# Patient Record
Sex: Male | Born: 2005 | Race: Black or African American | Hispanic: No | Marital: Single | Smoking: Never smoker
Health system: Southern US, Community
[De-identification: ages and names within clinical notes are randomized; demographics above are authoritative.]

## PROBLEM LIST (undated history)

## (undated) DIAGNOSIS — Z98811 Dental restoration status: Secondary | ICD-10-CM

## (undated) DIAGNOSIS — F809 Developmental disorder of speech and language, unspecified: Secondary | ICD-10-CM

## (undated) DIAGNOSIS — L309 Dermatitis, unspecified: Secondary | ICD-10-CM

## (undated) DIAGNOSIS — R21 Rash and other nonspecific skin eruption: Secondary | ICD-10-CM

## (undated) DIAGNOSIS — T7840XA Allergy, unspecified, initial encounter: Secondary | ICD-10-CM

## (undated) DIAGNOSIS — R6250 Unspecified lack of expected normal physiological development in childhood: Secondary | ICD-10-CM

## (undated) DIAGNOSIS — H7290 Unspecified perforation of tympanic membrane, unspecified ear: Secondary | ICD-10-CM

## (undated) DIAGNOSIS — H52209 Unspecified astigmatism, unspecified eye: Secondary | ICD-10-CM

## (undated) DIAGNOSIS — H9190 Unspecified hearing loss, unspecified ear: Secondary | ICD-10-CM

## (undated) HISTORY — PX: ADENOIDECTOMY: SUR15

## (undated) HISTORY — PX: CIRCUMCISION: SUR203

## (undated) HISTORY — PX: ADENOIDECTOMY: SHX5191

## (undated) HISTORY — PX: TYMPANOSTOMY TUBE PLACEMENT: SHX32

---
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
## 2024-01-01 ENCOUNTER — Telehealth (INDEPENDENT_AMBULATORY_CARE_PROVIDER_SITE_OTHER): Payer: Self-pay | Admitting: Pediatrics

## 2024-01-01 NOTE — Telephone Encounter (Signed)
 Who's calling (name and relationship to patient) :Toni Amend; Herbalist; [facility]   Best contact number: [phone]( work) [phone])  Provider they see: Angela Nevin, NP   Reason for call: Toni Amend was calling in to get info to help prepare [facility] for the upcoming appt.   She will also fax/email over 2 way consent.    Call ID:      PRESCRIPTION REFILL ONLY  Name of prescription:  Pharmacy:

## 2024-01-02 NOTE — Telephone Encounter (Signed)
 DPR scanned into the chart. Contacted Courtney, verified patiens name and DOB.   Toni Amend wanted to know what the patients initial appointment would Intel so that she can prepare the patient for the appointment.   Toni Amend stated that [facility] may not open up at this visit but she & mom will be with him to voice their concerns to the provider. The provider will determine the plan of care with them after they voice their concerns.  Courtney asked for a description of the layout of the building as she plans to walk with the patient into the office. Domenica Reamer was able to take over the call to further assist.  SS, CCMA

## 2024-01-05 NOTE — Telephone Encounter (Signed)
 Consent form has been received.

## 2024-01-07 ENCOUNTER — Encounter (INDEPENDENT_AMBULATORY_CARE_PROVIDER_SITE_OTHER): Payer: Self-pay | Admitting: Pediatrics

## 2024-01-07 ENCOUNTER — Ambulatory Visit (INDEPENDENT_AMBULATORY_CARE_PROVIDER_SITE_OTHER): Payer: MEDICAID | Admitting: Pediatrics

## 2024-01-07 VITALS — BP 138/84 | HR 72 | Ht 67.0 in | Wt 226.0 lb

## 2024-01-07 DIAGNOSIS — R4689 Other symptoms and signs involving appearance and behavior: Secondary | ICD-10-CM

## 2024-01-07 NOTE — Patient Instructions (Signed)
 - Referred to Dr. Corrin Parker for autism and psychological evaluation - if you do not hear from them for an appointment please call - Please provide a copy of IEP with evaluations for Dr. Corrin Parker - Please follow-up with legal aid and case management after evaluation with Dr. Corrin Parker - Please see the following resources for autism - Please STOP melatonin 12 mg nightly x 30 days. Re-start at 2-3 mg after a "melatonin holiday" - Please make an appointment with Forbes Cellar after evaluation with Dr. Corrin Parker to discuss potential medication management    Parent Training for child with ASD: It will be important for your child to receive extensive and intensive educational and intervention services on an ongoing basis.  As part of this intervention program, it is imperative that as parents you receive instruction and training in bolstering patient's social and communication skills as well as managing challenging behavior.  See resources below:  [facility] Autism Program - A program founded by [employer] that offers numerous clinical services including support groups, recreation groups, counseling, parent training, and evaluations.  They also offer evidence based interventions, such as Structured TEACCHing:         At [facility], we provide intervention services for children and adults with Autism Spectrum Disorder and their families utilizing the strategies of Structured TEACCHing. Sessions for school-age children involve parent coaching and adult sessions can be attended independently or involve family members. All sessions are individualized to address the individual's/family's unique goals and typically occur once weekly for up to 12-15 weeks. Goals for School-aged Children: Psychoeducation about ASD ? Daily living skills ? Behavior ? Emotion regulation ? Attention ? Organization ? Communication ? Social skills    Their main office is in [place] but they have regional centers across the state, including one in  [place]. Main Office Phone: [phone] [facility] Office: [address], [place], [place] [address].  [place] Phone: [phone]   [employer] in [place] offers direct instruction on how to parent your child with autism.  [employer] [facility]! Individualized family sessions for parents/caregivers of children with autism. Gain confidence using autism-specific evidence-based strategies. Feel empowered as a caregiver of your child with autism. Develop skills to help troubleshoot daily challenges at home and in the community. Family Session: One-on-one instructional sessions with child and primary caregiver. Evidence-based strategies taught by trained autism professionals. Focus on: social and play routines; communication and language; flexibility and coping; and adaptive living and self-help. Financial Aid Available See Family Sessions:[employer] Go! On the their website: [email or web address].hu Contact Danae Chen at [phone], ext. [phone] or [email or web address]   [facility] [place] [facility] also offers FREE weekly classes, often with a focus on addressing challenging behavior and increasing developmental skills. [email or web address]  Autism Society of [place] - offers support and resources for individuals with autism and their families. They have specialists, support groups, workshops, and other resources they can connect people with, and offer both local (by county) and statewide support. Please visit their website for contact information of different county offices. https://www.autismsociety-[place].org/  After the Diagnosis Workshops:   "After the Diagnosis: Get Answers, Get Help, Get Going!" sessions on the first Tuesday of each month from 9:30-11:30 a.m. at our [place] office located at [address].  Geared toward families of ages 45-8 year olds.   Registration is free and can be accessed online at our  website:  [email or web address].[email or web address]/calendar/ or by Darrick Penna Smithmyer for more information at [email or web address]  [employer] provides  video based training on autism, treatments, and guidance for managing associated behavior.  This website is free for access the family's most register for first review the content: H TTP://www.autisminternetmodules.org/  The [employer] [facility]) - This website offers Autism Focused Intervention Resources & Modules (AFIRM), a series of free online modules that discuss evidence-based practices for learners with ASD. These modules include case examples, multimedia presentations, and interactive assessments with feedback. https://afirm.PureLoser.pl  [facility]: [facility] - JumpStart (serving 18 month- 19 y/o) is a six-week parent empowerment program that provides information, support, and training to parents of young children who have been recently diagnosed with or are at risk for ASD. JumpStart gives family access to critical information so parents and caregivers feel confident and supported as they begin to make decisions for their child. JumpStart provides information on Applied Behavior Analysis (ABA), a highly effective evidence-based intervention for autism, and Pivotal Response Treatment (PRT), a behavior analytic intervention that focuses on learner motivation, to give parents strategies to support their child's communication. Private pay, accepts most major insurance plans, scholarship funding Https://www.autismcenter.org/jumpstart [phone]   Crisis by [facility] if this is a medical or life threatening emergency.  If you need the police, ask for a CIT officer. They have received extra training on handling these situations.  If this is NOT a medical or life threatening emergency, look in the directory below for resources in your county  Crisis Services  for are managed by specific agencies depending on your county of residence  YOU HAVE A CHOICE ABOUT HOW TO GET SERVICES WHEN YOU ARE IN A CRISIS  Phone First. There is an [employer] is available 24 hours a day, 7 days a week. Customer Service Specialists will assist you to find a crisis provider that is well-matched with your needs. Find your local number below.   If you already have a service provider, call them first. Providers who know you are usually best prepared to assist you in a crisis.  Have Support Come to You. Crisis situations are often best resolved at home. Mobile Crisis Teams are available 24 hours a day in all counties. Professional counselors will speak with you and your family during a visit. They have an average response time of 2 hours. See below for your Mobile Crisis contact  Go To A Crisis Center. Many counties have a specialized crisis center where you can walk in for a crisis assessment and referrals to additional services. Appointments are not needed. The crisis center is listed below  For additional information by county, please visit the following website: [email or web address].pt   [employer] Managed by: Phone [employer] Crisis Team Go to a [facility]  [facility] [phone] Psychotherapeutic Services [phone] [facility] [address] [place] [phone] Sunday - Saturday - 8:00 a.m. - 8:00 p.m.  [facility] [phone] [facility] [phone] [facility] Health Services [facility] [address] [place], [place] [address] [phone] Monday - Friday 8a - 5p   Coralee Pesa [facility] Health Management [phone] [facility] Recovery Services [phone] [facility] Recovery Services [address] Juline Patch [place] [address] [phone] Monday -- Friday - 8:00 a.m. - 8:00 p.m.   [employer] [facility]al Health  Management  [phone] [facility] Recovery Services [phone]  None available CALL [phone]   [facility] Behavioral Health Management [phone] [facility] Recovery Services [phone] [facility] Recovery Services [address]  [place], [place], [place] [address] [phone] Monday -- Friday - 8:00 a.m. - 5:00 p.m.   [place] Resources [phone] [facility] [phone] [facility] [address] [place], [place] [address] [phone] Monday -- Friday - 8:00 a.m. - 5:00 p.m.   [facility] Management [phone] [facility] Recovery Services [phone] [facility] Recovery Services [address]Ninfa Meeker [phone] Monday -- Friday - 8:00 a.m. - 5:00 p.m   [facility] Health [phone] [facility] Recovery Services [phone] [facility] Recovery Services [address], [place], [place] [address] [phone] Monday -- Friday - 8:00 a.m. - 5:00 p.m   [facility] [phone] [facility] [phone] [facility] Recovery Services [address] [place], [place], [place] [address] [phone] Monday -- Friday - 8:00 a.m. - 5:00 p.m.   [employer] Health [phone] or for TTY - contact  Relay at [facility] Recovery Services [phone] None available CALL [phone]  [facility] Behavioral Health Management [phone] [facility] Recovery Services [phone] [facility] Recovery Services [address]; Letitia Libra [phone] Monday -- Friday - 8:00 a.m. - 5:00 p.m   [facility] [phone] [facility] Recovery Services [phone]  [facility] Recovery Services [address] [place] [address] [place], [place]  [address] Monday - Friday 8a - 5p [phone]  [facility] Management [phone] [facility] Recovery Services [phone] [facility] Recovery Services [address] Elliot Gurney [phone] Monday -- Friday - 9:00  a.m. - 3:00 p.m.       Sleep and Autism: Sleep challenges are common in ASD; therefore, it is recommended that the family follow-up with their developmental pediatrician to ask for advice. More information about sleep challenges and ASD, as well as suggestions for families, can be found here: https://www.autismspeaks.org/sleep https://www.autism.org.uk/advice-and-guidance/topics/physical-health/sleep   Sleep Hygiene: Sleep challenges likely contribute to difficulties with behavior; therefore, getting adequate sleep is very important. Establishing consistent sleep hygiene procedures is recommended to help improve sleep patterns.  Ideas include discontinuing the consumption of sugar and beverages after dinner, maintaining a set bedtime. and limiting screen time one hour before bedtime. Daylight is also key to regulating daily sleep patterns. Try to get Damyon outside in natural sunlight for at least 30 minutes each day. Aim for him to get an hour of exposure to morning sunlight and turn down the lights before bedtime.  Lastly, practice a calming bedtime routine that is the same every night. Start with a relaxing bath, putting on pajamas in low light, and finish with a story or a song with them in bed and under the covers.   Sleep & Anxiety: We recommend visiting www.babysleep.com as they provide sleep expectations and advice based on your child's age. Arkin may benefit from using a comfort object (e.g., blanket, toy) at night to help him fall asleep and self-soothe when he wakes in the night. Parents might initially help him use the comfort object to help him practice self-soothing. A social story may also help them learn about sleeping in their own bed or falling back asleep when he wakes in the night.

## 2024-01-07 NOTE — Progress Notes (Signed)
 [facility]-DEVELOPMENTAL AND BEHAVIORAL Dept: [phone]   New Patient Initial Visit   Tony [name] is a 18 y.o. referred to Developmental Behavioral Pediatrics for the following concerns: "psycho-educational testing/autism evaluation due to pt with strong features of ASD but never diagnosed. Needs assessment for academic and work training placement plus general care as adult" per referral 09/19/23  Tony [name] was referred by Maree Erie, MD who "has been his doctor since 10 days old"  History of present concerns: Tony [name] is a 18yo, male, who presents to the office with his mother, Tony [name], for a referral for a psychological evaluation and autism testing. Mom reports she has been attempting to get Tony [name] for autism for several years "he needs a proper diagnosis" She reports she has been "fighting with the schools for a long time -  a counselor at the school told me he was no where near on the spectrum but I knew things were off."  Social-emotional reciprocity: - Social-emotional reciprocity (eg, failure of back-and-forth conversation; reduced sharing of interests, emotions)   COMMENTS  Difficulty maintaining a conversation [x] YES [] NO Avoidant  Abnormal sharing of enjoyment [] YES [] NO   Abnormal back and forth play [] YES [] NO   Abnormal social approach [x] YES [] NO Intrusive when younger - poor recognition of social cues  Reduced sharing emotion/affect [] YES [] NO   Abnormal social imitation [] YES [] NO   Abnormal response to name [] YES [] NO   Idiosyncratic phrases/speech "Can you think of any phrases your child says that seem different from what other children their age would say?" "Are there any specific words or expressions your child uses that only you seem to understand?" "Does your child have any unique ways of asking for things or expressing needs?" [] YES [] NO   Abnormal initiation of social interaction   [x] YES [] NO   Fails to show  appropriate interest in peer's interests [x] YES [] NO     Nonverbal communication: - Nonverbal communicative behaviors used for social interaction (eg, poorly integrated verbal and nonverbal communication; abnormal eye contact or body language; poor understanding of gestures)   COMMENTS  Abnormal eye contact [x] YES [] NO Avoidant  Lack of or decreased use of gestures [] YES [] NO   Lack of use of a point [] YES [] NO   Inability to follow a point [] YES [] NO   Decreased use of facial expressions [] YES [] NO   Difficulty reading nonverbal social cues/facial expressions [x] YES [] NO   Poorly integrated verbal/nonverbal communication [x] YES [] NO   Unusual speech patterns [] YES [] NO      Developing and maintaining relationships: - Developing, maintaining, and understanding relationships (eg, difficulty adjusting behavior to social setting; difficulty making friends; lack of interest in peers)   COMMENTS  Difficulty making friends [x] YES [] NO   Difficulty keeping friends [x] YES [] NO   Lack of interest in other people [x] YES [] NO       Prefers to be alone [x] YES [] NO   Does not pay attention to peers' interests [x] YES [] NO Especially when younger  Difficulty sharing imaginative play with peers [] YES [] NO   Inability to understand another person's perspective [x] YES [] NO   Interacts better with adults than peers [] YES [] NO   Difficulty forming meaningful relationships [] YES [] NO   Lack of interest in play dates or outings with peers outside of school/therapy   [] YES [] NO     Stereotypical behaviors: - Stereotyped or repetitive movements, use of objects, or speech (eg, stereotypes, echolalia, ordering toys, etc) - Insistence on sameness, unwavering adherence to routines, or ritualized patterns of  behavior (verbal or nonverbal)   COMMENTS  Scripted speech/echolalia "Does your child repeat certain phrases or words often, even when they don't seem to fit the situation?" "Are there any phrases your  child says that seem like they are 'scripting' or repeating something they've heard before?" [] YES [] NO   Hand flapping or other Unusual hand movements [] YES [] NO   Spinning self or objects [] YES [] NO   Lining toys [x] YES [] NO Now stacking items  Repetitive play [x] YES [] NO   Preoccupation with parts of objects [] YES [] NO   Repetitive movements: pacing, rocking [] YES [] NO   Self abusive behavior [] YES [] NO   Looks at objects close to eyes or out of corners of eyes or at unusual angles [] YES [] NO   toe walking [] YES [] NO   Other        Restricted Interests Restricted, repetitive patterns of behavior, interests, or activities;  - Highly restricted, fixated interests that are abnormal in strength or focus (eg, preoccupation with certain objects; perseverative interests)    COMMENTS  Current Obsessions/Restricted interests [] YES [] NO   Past restricted interests [] YES [] NO   Talks about a subject excessively [] YES [] NO   Fascination with numbers/letters or patterns [] YES [] NO   Unusual interests [] YES [] NO   Attachment to unusual inanimate objects [] YES [] NO      Unusual Need for Routine   Comments  Upset by changes in routine/schedule [x] YES [] NO   Difficulty with transitions [x] YES [] NO   Upset by trivial changes [x] YES [] NO   Resistant to change in environment [x] YES [] NO   Need for things to be organized in a certain way  [x] YES [] NO   Ritualized patterns of behavior [] YES [] NO     Hyper/Hypo sensitivity  - Increased or decreased response to sensory input or unusual interest in sensory aspects of the environment (eg, adverse response to particular sounds; apparent indifference to temperature; excessive touching/smelling of objects)   Comments  General [] YES [] NO   Auditory [x] YES [] NO "Severe" CAPD  Visual  [] YES [] NO   Touch [] YES [] NO   Movement [] YES [] NO   Oral [] YES [] NO   Smell  [] YES [] NO     Behavioral concerns: Aggression. He has stopped having conversations  with mom. Points in her face. He seems angry with her. "There is always something happening at this hotel." He does go outside and walk around - not a great area where the hotel is. Built a fort on bed - if you even try to move anything he will confront mom. Bullied from pre-K up throughout middle school and HS. Mom thought it had stopped. Since 5th grade hygiene has dropped off which continues - "it's a big fight - he used to take pride in how he looks."  Developmental status: Met them all in a timely manner however regression noted. 18yo doing 50-100 piece puzzles often on his own. 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 Able to make some friends. Independent with ADLs - can cook. 16-20 months hearing exam "were flat line" has had multiple ear tube placements.  School history: [employer] - "wants to be there because his brother graduated from there" - 12th grade - grades "somewhat okay" They are telling him he can graduate. Fear of the bus due to being bullied. Mom reports Tony [name] has never had behavior issues at school except for one time when he defended a peer. "He  has a superman complex"  School supports: [x] Does     [] Does not  have a    [] 504 plan or    [x] IEP   at school  Sleep: From early childhood if going to bed at 7pm he is up at 3am and he wants to eat. When he can't sleep he will talk to himself (doesn't talk much during the day). Now going to sleep at 0200-0300 in the morning "it's a fight" Takes melatonin 12 mg - recommended taking a "melatonin holiday" Frequently moving hotels which has been disruptive. Mom has insomnia as well.  Appetite: Not a picky eater - appetite is great - eats a variety of foods  Medication trials: Wellbutrin - "adverse side effects" Adderall "started Tony dose and he had side effects" - years ago Has had pharmacogenetic  testing Allergies to certain meds "Augmentin" Certain dyes increased hyperactivity Sugary drinks cause more agitation  Therapy interventions: Has seen psychiatry. Outside therapy was inconsistent for a time. Currently going to Tony [name] therapy. Virtual weekly sessions since December 2024 - Tony [name], Tony [name] - she is aware of his behaviors.  Medical workup: Hearing: "intermittent hearing loss" Vision: No concerns Genetic testing: No Other labs: Routine labs Imaging: None  Previous Evaluations: For IEP at school - copy requested for chart and future evaluations  Past Medical History:  Diagnosis Date   Allergy    seasonal allergies   Astigmatism    wears glasses   Dental crowns present    upper   Developmental delay    Eczema    Hearing loss    failed hearing screening at school   Jaundice of newborn    resolved   Rash 04/06/2013   elbows   Speech delay    Tympanic membrane perforation 03/2013   left     family history includes Anesthesia problems in his mother; Asthma in his brother and mother; Diabetes in his maternal aunt and maternal grandmother; Heart disease in his maternal grandmother; Hypertension in his mother; Other in his maternal grandfather; Sickle cell trait in his brother and mother; Thyroid disease in his mother.   Social History   Socioeconomic History   Marital status: Single    Spouse name: Not on file   Number of children: Not on file   Years of education: Not on file   Highest education level: Not on file  Occupational History   Not on file  Tobacco Use   Smoking status: Never   Smokeless tobacco: Never  Substance and Sexual Activity   Alcohol use: No   Drug use: No   Sexual activity: Not on file  Other Topics Concern   Not on file  Social History Narrative   Lives with mother and has adult brother in graduate school. No pets.   Enjoys:watching TV, going for walks, playing video   Social Drivers of Health   Financial Resource Strain: Not  on file  Food Insecurity: Food Insecurity Present (06/13/2023)   Hunger Vital Sign    Worried About Running Out of Food in the Last Year: Sometimes true    Ran Out of Food in the Last Year: Sometimes true  Transportation Needs: No Transportation Needs (03/26/2023)   PRAPARE - Administrator, Civil Service (Medical): No    Lack of Transportation (Non-Medical): No  Physical Activity: Not on file  Stress: Not on file  Social Connections: Not on file     Birth History   Delivery Method: Vaginal, Spontaneous  Gestation Age: 23 wks    Swallowed meconium at birth, suctioned in delivery room, no NICU.     Screening Results   Newborn metabolic     Hearing      Review of Systems  Constitutional: Negative.   HENT:  Positive for hearing loss ("intermittent hearing loss").   Respiratory: Negative.    Gastrointestinal:  Positive for constipation.  Endocrine: Negative.   Genitourinary: Negative.   Musculoskeletal: Negative.   Skin: Negative.   Allergic/Immunologic: Positive for environmental allergies.  Neurological: Negative.   Hematological: Negative.   Psychiatric/Behavioral:  Positive for behavioral problems (irritable and confrontational with mom) and sleep disturbance.     Objective: Today's Vitals   01/07/24 0838  BP: 138/84  Pulse: 72  SpO2: 100%  Weight: 226 lb (102.5 kg)  Height: 5\' 7"  (1.702 m)   Body mass index is 35.4 kg/m.  Physical Exam Vitals reviewed.  Constitutional:      Appearance: Normal appearance. He is obese.  HENT:     Head: Normocephalic and atraumatic.  Eyes:     Extraocular Movements: Extraocular movements intact.     Pupils: Pupils are equal, round, and reactive to light.  Cardiovascular:     Rate and Rhythm: Normal rate and regular rhythm.     Heart sounds: Normal heart sounds.  Pulmonary:     Breath sounds: Normal breath sounds.  Abdominal:     General: Bowel sounds are normal.     Palpations: Abdomen is soft.   Musculoskeletal:        General: Normal range of motion.     Cervical back: Normal range of motion.  Skin:    General: Skin is warm and dry.  Neurological:     General: No focal deficit present.     Mental Status: He is alert and oriented to person, [name], and time.     Cranial Nerves: Cranial nerves 2-12 are intact.     Sensory: Sensation is intact.     Motor: Motor function is intact.     Coordination: Coordination is intact.     Gait: Gait is intact.  Psychiatric:        Attention and Perception: He is inattentive.        Mood and Affect: Affect is flat.        Speech: Speech normal.        Behavior: Behavior is withdrawn.        Judgment: Judgment is impulsive.     Comments: Avoidant eye contact, headphones on throughout visit, difficult to engage and answered questions with one word. Reports mood as "fine"    ASSESSMENT/PLAN: Tony [name] is a 18yo, male, who presents to the office with his mother, Tony [name], for a referral for a psychological evaluation and autism testing. Mom reports she has been attempting to get Tony [name] [name] for autism for several years "he needs a proper diagnosis" She reports she has been "fighting with the schools for a long time -  a counselor at the school told me he was no where near on the spectrum but I knew things were off."  Mom reports at 1yo he was doing 50-100 piece puzzles, often on his own. 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 In kindergarten Tony [name] "wrote a Social research officer, government and his handwriting was perfect then it became messy"He had difficulty formulating friendships with his peers. He was not communicating with mom like he used to - he became standoffish and avoidant especially with eye contact. Lining and stacking toys. Now  stacks items "now it's to the point of OCD like. He gets very upset when you disrupt his stacks of items to the point of hysteria." He will often "get irate, points his finger in Tony face, and has put hands on me."  Mom reports they  have been in and out of hotels since March 2020 (COVID). During this time he would become quite upset (transitions remain hard) and threatened suicide - mom was concerned and advised the teachers of this "they just gave him stuff to color with."  Mom has HX of thyroid cancer - had surgery and now is losing her voice due to resulting vocal cord damage  Case management seen 09/19/23 per note: "Mom is interested in pursing additional support for Tony [name], as well as guardianship. He currently receives SSI and has since he was approximately 18yo. Discussed referral to [name]) for further assistance with navigating application for guardianship. Mom agrees to the referral. Legal Aid can also help mom look into SSI and why his benefits were decreased."  Nirvan is suspected to have autism spectrum disorder (ASD) and requires a formal evaluation to determine the extent of the condition and its impact on their ability to manage daily tasks, make decisions, and handle responsibilities. This evaluation is necessary for guardianship purposes, as it will help assess the individual's cognitive, emotional, and social functioning. The formal assessment will provide important insights into whether the individual requires a legal guardian to assist with decision-making, financial matters, or personal care, ensuring that appropriate support systems are put in [name].   - Referred to Dr. Corrin Parker for autism and psychological evaluation - if you do not hear from them for an appointment please call - Please provide a copy of IEP with evaluations for Dr. Corrin Parker - Please follow-up with legal aid and case management after evaluation with Dr. Corrin Parker - Please see the following resources for autism - Please STOP melatonin 12 mg nightly x 30 days. Re-start at 2-3 mg after a "melatonin holiday" - Please make an appointment with Forbes Cellar after evaluation with Dr. Corrin Parker to discuss potential medication management   On the  day of service, I spent 90 minutes managing this patient, which included the following activities:  Review of the patient's medical chart and history Discussion with the patient and their family to address concerns and treatment goals Review and discussion of relevant screening results Coordination with other healthcare providers, including consultation with the supervising physician Management of orders and required paperwork, ensuring all documentation was completed in a timely and accurate manner      Forbes Cellar PMHNP-BC [name]

## 2024-01-09 ENCOUNTER — Ambulatory Visit: Payer: MEDICAID | Admitting: Pediatrics

## 2024-01-14 ENCOUNTER — Telehealth (INDEPENDENT_AMBULATORY_CARE_PROVIDER_SITE_OTHER): Payer: Self-pay | Admitting: Pediatrics

## 2024-01-14 NOTE — Telephone Encounter (Signed)
 Who's calling (name and relationship to patient) : Tony [name]; mom   Best contact number: [phone]  Provider they see: Angela Nevin, NP  Reason for call: Returned call from mom. Mom Tony [name]) wanted to let Angela Nevin know that he has been schedule with [facility] and wanted to know if he can be seen sooner. He has been scheduled for 08/09/24, she stated he would need to be seen before then  and needs treatment. She said she will also reach out to PCP.    Call ID:      PRESCRIPTION REFILL ONLY  Name of prescription:  Pharmacy:

## 2024-01-15 NOTE — Telephone Encounter (Signed)
 Message to Dr. Corrin Parker to determine if she has any early appointments

## 2024-01-21 ENCOUNTER — Encounter: Payer: Self-pay | Admitting: Pediatrics

## 2024-01-21 ENCOUNTER — Ambulatory Visit (INDEPENDENT_AMBULATORY_CARE_PROVIDER_SITE_OTHER): Payer: MEDICAID | Admitting: Pediatrics

## 2024-01-21 VITALS — Wt 217.0 lb

## 2024-01-21 DIAGNOSIS — R4689 Other symptoms and signs involving appearance and behavior: Secondary | ICD-10-CM

## 2024-01-21 DIAGNOSIS — H6120 Impacted cerumen, unspecified ear: Secondary | ICD-10-CM

## 2024-01-21 DIAGNOSIS — R634 Abnormal weight loss: Secondary | ICD-10-CM

## 2024-01-21 DIAGNOSIS — M533 Sacrococcygeal disorders, not elsewhere classified: Secondary | ICD-10-CM

## 2024-01-21 DIAGNOSIS — Z72810 Child and adolescent antisocial behavior: Secondary | ICD-10-CM

## 2024-01-21 NOTE — Patient Instructions (Addendum)
 Use Debrox wax softener to help with the ear wax problem If this does not promote lots of drainage, will send back to ENT for cleaning   There are lots of generic or brand names of this product and either one is fine. Use at bedtime for 3 night Can repeat each month   I will enter referral to see if we can move up his psychiatry appointment

## 2024-01-21 NOTE — Progress Notes (Unsigned)
   Subjective:    Patient ID: Tony [name], male    DOB: 19-May-2006, 19 y.o.   MRN: [id number]  HPI Chief Complaint  Patient presents with   Follow-up    behavior    [name] is her for follow up on behavior.  He is accompanied by his mother and he give permission for mom to stay in room and provide history. Would like to be seen sooner because of difficulty managing behavior at home.  Chart review is completed as pertinent to today's visit.  [name] was seen at [facility] Cellar, NP on 3/12 and is referred to Dr. Corrin Parker for further evaluation; appointment is set for 08/09/2024 but message in EHR suggests team is looking for earlier slot.  School:  not going and mom cannot make him go - gets upset Has 11/12 grade status.  Mom states difficulty communicating with school Has IEP - updated in November but without mom present bc could not get there on short notice  Nutrition:  Had GI virus last week with vomiting x 2 days, then diarrhea.  Some cough. Initial subjective fever. Lost weight while sick. No vomiting now x almost a week, eating better now  Sleep: Chronic issue with sleep; up last night until early am.  Typically will sleep 7 to 8 hours once asleep. Was taking melatonin and had upped dose to 12 mg without adequate benefit.  Now on melatonin break, as advised by NP, to help reset his sleep cycle.  Exercise:  Goes for walks  Mood:  Continued difficulty at home and mom states she has learned to not push it for safety.  [name] continues with [facility] weekly sessions, often remote.  Other concerns: - Likes to keep the room cold (lowest room thermostat will allow is 60).  Wears clothes during the day but likes shorts.  Sleeps undressed at night but covers with blanket. Mom questions if something is off with his hormones and temp regulation. -Mom states he sometimes complains of pain at his coccyx area and she is concerned if cyst or other problem.  He does  perspire a lot at lower back and gluteal cleft area and has lots of body hair there. Mom states not sure if had any injury bc he will place paper in his gluteal cleft and lower back area to absorb perspiration; also concern for other hygiene related issues.  Use generic Voltaren and warm compress in the past with relief.  Kentrell states no problem today.  PMH, problem list, medications and allergies, family and social history reviewed and updated as indicated.   Review of Systems As noted in HPI above.    Objective:   Physical Exam        Assessment & Plan:   Referral to psychiatry

## 2024-01-30 ENCOUNTER — Ambulatory Visit (INDEPENDENT_AMBULATORY_CARE_PROVIDER_SITE_OTHER): Payer: MEDICAID | Admitting: Psychology

## 2024-01-30 DIAGNOSIS — F639 Impulse disorder, unspecified: Secondary | ICD-10-CM

## 2024-01-30 DIAGNOSIS — R4689 Other symptoms and signs involving appearance and behavior: Secondary | ICD-10-CM

## 2024-02-10 ENCOUNTER — Ambulatory Visit (INDEPENDENT_AMBULATORY_CARE_PROVIDER_SITE_OTHER): Payer: Self-pay | Admitting: Psychology

## 2024-02-18 ENCOUNTER — Telehealth (INDEPENDENT_AMBULATORY_CARE_PROVIDER_SITE_OTHER): Payer: Self-pay | Admitting: Psychology

## 2024-02-18 NOTE — Telephone Encounter (Signed)
 Mom would like for you to call her; she has some questions about testing

## 2024-02-25 ENCOUNTER — Ambulatory Visit (INDEPENDENT_AMBULATORY_CARE_PROVIDER_SITE_OTHER): Payer: MEDICAID | Admitting: Psychology

## 2024-02-25 DIAGNOSIS — F639 Impulse disorder, unspecified: Secondary | ICD-10-CM

## 2024-02-25 DIAGNOSIS — F338 Other recurrent depressive disorders: Secondary | ICD-10-CM

## 2024-02-25 DIAGNOSIS — R454 Irritability and anger: Secondary | ICD-10-CM

## 2024-02-25 DIAGNOSIS — R6250 Unspecified lack of expected normal physiological development in childhood: Secondary | ICD-10-CM

## 2024-02-25 DIAGNOSIS — H9325 Central auditory processing disorder: Secondary | ICD-10-CM

## 2024-02-25 NOTE — Progress Notes (Addendum)
 Tony [name] was seen for a testing session by request of Olam Bergeron, NP due to concerns related to becoming easily angered and agitated, aggression/cursing directed at his mother, central auditory processing disorder, lack of interest in peers and activities once enjoyed, significant change in affect and behavior that occurred around first grade, decreased use of speech and language, developmental delays over time, sensory processing difficulties, sensitivities to sound, and suspicion of autism spectrum disorder (ASD).    The testing session was conducted Face to Face . Of note, the primary language spoken at home is   Biological Sex: male  Preferred pronouns: he/him  Start Time:   1:30 PM  End Time:   3:00 PM  Provider/Observer:  Yevonne Heman. Jerriyah Louis, Radiographer, therapeutic  Reason for Service: Psychological Assessment     Behavioral Observations: Fenix presents as a 18 y.o.-year-old, African American, male, who appeared to be his stated age. His behavior was atypical for an adolescent of his age. Spoken language was consisted of few words/phrase speech but was clear and easy to understand and the examiner noted that the rate and rhythm of speech were normal, but tone was somewhat flat. There were not any physical disabilities noted and Julien displayed appropriate level level of cooperation and motivation, although he expressed being somewhat unhappy with being at the appointment.  Pt was not taking prescribed medication at the time of this appointment. Overall, pt's behaviors during testing suggest that these results provide reliable estimates of his current cognitive abilities and behavioral characteristics/traits.   Mental status exam        Orientation: oriented to time, place, and person                   Attention: attention span and concentration were age appropriate        Mood/Affect: Pt appeared to be irritable and affect was mood-congruent                   Physical  Appearance:no concerns about hygeine   Assessment:  The Wechsler Adult Intelligence Scale, Fifth Edition (WAIS-V) is a comprehensive set of tests used to measure various areas of cognitive functioning (e.g., verbal comprehension, fluid reasoning, visual-spatial abilities, processing speed, and working memory) among adolescent's and adults between the ages of 16 years, 0 months and 90 years, 11 months. The WAIS-V generates composite scores which provide a standardized measure of both overall cognitive functioning (Full Scale Intelligence Quotient; FSIQ) and nonverbal intelligence (NVI). Of note, the FSIQ is considered the most reliable score and is most representative of overall cognitive functioning. The subtests of the WAIS-V were administered by the clinician on this date, from which scores will be generated and interpreted.   Clinician also asked Cataldo to complete the PCL-5 (trauma screener), GAD-7 (anxiety screener), and PHQ-A (depression screener), as well as engaged him in some conversation. Ihan reported having difficulties with recurrent auditory hallucinations that have occurred since 4th grade, and have persisted ever since. He also reported that these voices get worse when he is under stress. Clinician spoke with Gareld June mother, at which time she asked if Benjimen has always had flat affect, at which time she stated that he used to be very animated and engaging, but that this changed when he was in approximately 1st or 2nd grade. Ms Gillen stated that she thought he experienced these changes because his brother moved away near this time, but then stated that he never returned to himself. Examiner asked Ms. Coufal if she  has noticed anything in Demarion's behavior, at which time she stated that he often talks aloud to himself. She also reported that she feels as though Braxley has "manic" and "depressive" days. She also stated that he occasionally experiences panic attacks in response to minor  events.   Plan: During today's appointment, in-person testing took place. Examiner administered the WAIS-V. Additionally, clinician ensured that rating scales have been completed, including the ASRS, BASC-3, PHQ-A, GAD-7, and PCL-5.  Graeden and his mother will return for another testing appointment to complete the ADOS-2. The testing plan has been discussed with the parent and patient who expressed understanding. Next testing appointment has been scheduled for 02/27/2024 at 4:00 PM.   Impression/Diagnosis:  Depressive Disorder (possible) Anxiety Disorder (possible) Possible autism spectrum disorder  Vallarie Gauze,  [place] Provisionally Licensed Psychologist [phone]  [facility] & [facility] [address] [place], [address]  [place], [place] [address] Phone: [phone]

## 2024-02-26 ENCOUNTER — Telehealth (INDEPENDENT_AMBULATORY_CARE_PROVIDER_SITE_OTHER): Payer: Self-pay | Admitting: Psychology

## 2024-02-26 ENCOUNTER — Ambulatory Visit (INDEPENDENT_AMBULATORY_CARE_PROVIDER_SITE_OTHER): Payer: Self-pay | Admitting: Psychology

## 2024-02-26 NOTE — Telephone Encounter (Signed)
 Who's calling (name and relationship to patient) : Damon Gersh; mom   Best contact number: [phone]  Provider they see: Dator, PhD  Reason for call: Mom called in needing to rs appt for today. She asked if Dator could give her a call back.    Call ID:      PRESCRIPTION REFILL ONLY  Name of prescription:  Pharmacy:

## 2024-02-27 ENCOUNTER — Ambulatory Visit (INDEPENDENT_AMBULATORY_CARE_PROVIDER_SITE_OTHER): Payer: MEDICAID | Admitting: Psychology

## 2024-02-27 ENCOUNTER — Encounter (INDEPENDENT_AMBULATORY_CARE_PROVIDER_SITE_OTHER): Payer: Self-pay | Admitting: Psychology

## 2024-02-27 DIAGNOSIS — F639 Impulse disorder, unspecified: Secondary | ICD-10-CM

## 2024-02-27 DIAGNOSIS — R4689 Other symptoms and signs involving appearance and behavior: Secondary | ICD-10-CM | POA: Diagnosis not present

## 2024-02-27 DIAGNOSIS — F338 Other recurrent depressive disorders: Secondary | ICD-10-CM

## 2024-02-27 NOTE — Progress Notes (Signed)
 Tony [name] was seen for a testing session by request of Olam Bergeron, NP due to concerns related to becoming easily angered and agitated, aggression/cursing directed at his mother, central auditory processing disorder, lack of interest in peers and activities once enjoyed, significant change in affect and behavior that occurred around first grade, decreased use of speech and language, developmental delays over time, sensory processing difficulties, sensitivities to sound, and suspicion of autism spectrum disorder (ASD).     The testing session was conducted Face to Face . Of note, the primary language spoken at home is [place].   Biological Sex: male  Preferred pronouns: he/him  Start Time:   4:10 PM End Time:   5:10 PM  Provider/Observer:  Yevonne Heman. Marylon Verno, Radiographer, therapeutic  Reason for Service: Psychological Assessment     Behavioral Observations: Tony [name] presents as a 18 y.o.-year-old, African American, male, who appeared to be his stated age. His behavior was atypical for an adolescent of his age. Spoken language was consisted of few words/phrase speech but was clear and easy to understand and the examiner noted that rate and rhythm of speech were normal, but tone was flat. There were not any physical disabilities noted and Tony [name] displayed appropriate level level of cooperation and motivation, although he expressed displeasure and discomfort with some of the activities completed.  Pt was not taking prescribed medication at the time of this appointment. Overall, pt's behaviors during testing suggest that these results provide reliable estimates of his current behavioral characteristics/traits. Pt fidgeted throughout the appointment and wore a mask which he stated makes him feel more comfortable, although not related to disease prevention.  Mental status exam        Orientation: oriented to time, place, and person                   Attention: attention span and concentration  were age appropriate        Mood/Affect: Pt appeared to be constricted and affect was blunted                   Physical Appearance: No significant concerns about hygiene.    Assessment:   The Autism Diagnostic Observation Schedule, Second Edition (ADOS-2) is a semi-structured standardized assessment that is used to facilitate observations of an individual's behavioral characteristics related to communication, social-interaction, and imagination. Additionally, during the activities of the ADOS-2, clinicians take note of the presence of any restricted/repetitive behaviors or interests, sensory sensitivities, sensory interests, atypical speech, stereotypy (repetitive motor movements), anxiety, challenging behaviors, and overactivity. Individuals are scored based upon the observations made by the clinician. There are five modules of the ADOS-2; clinicians choose the appropriate module based on the age and language development of the child. For the present assessment, the examiner used Module 4 which is meant to be used with older adolescents (16 or older) with fluent speech. Although Modules 1 - 3 provide an ADOS-2 Comparison Score (a scale from one to ten that indicates the severity of symptoms observed) this score is not available for Module 4. Scores will be presented and interpreted in the final report.   Plan: During today's appointment, in-person testing took place. Examiner administered the ADOS-2. Tony [name] and his mother will return for a feedback session, which will be scheduled early next week. At the feedback, the examiner will explain and interpret the findings, answer questions, and offer support/recommendations. The testing plan has been discussed with the parent who expressed understanding.    Impression/Diagnosis:  Possible  Depressive Disorder Possible autism spectrum disorder   Vallarie Gauze,  [place] Provisionally Licensed Psychologist [phone]  [facility] Development & [facility] [address] [place], [address]  [place], [place] [address] Phone: [phone]

## 2024-02-27 NOTE — Telephone Encounter (Signed)
 Patient seen today at 4 PM (02/27/2024). Closing encounter.

## 2024-03-26 ENCOUNTER — Telehealth (INDEPENDENT_AMBULATORY_CARE_PROVIDER_SITE_OTHER): Payer: Self-pay | Admitting: Psychology

## 2024-03-26 NOTE — Telephone Encounter (Signed)
 Aichatou called from the patient's PCP requesting his completed CCA. It can be faxed to [phone] and ATTN to Aichatou.

## 2024-03-28 ENCOUNTER — Encounter: Payer: Self-pay | Admitting: Emergency Medicine

## 2024-03-28 ENCOUNTER — Ambulatory Visit
Admission: EM | Admit: 2024-03-28 | Discharge: 2024-03-28 | Disposition: A | Discharge status: Home / Self Care | Payer: MEDICAID

## 2024-03-28 DIAGNOSIS — L089 Local infection of the skin and subcutaneous tissue, unspecified: Secondary | ICD-10-CM

## 2024-03-28 DIAGNOSIS — R234 Changes in skin texture: Secondary | ICD-10-CM

## 2024-03-28 DIAGNOSIS — B9689 Other specified bacterial agents as the cause of diseases classified elsewhere: Secondary | ICD-10-CM

## 2024-03-28 DIAGNOSIS — H9201 Otalgia, right ear: Secondary | ICD-10-CM

## 2024-03-28 MED ORDER — DOXYCYCLINE HYCLATE 100 MG PO CAPS: 100.0000 mg | ORAL_CAPSULE | Freq: Two times a day (BID) | ORAL | 0 refills | Status: AC

## 2024-03-28 MED ORDER — MUPIROCIN 2 % EX OINT: 1.0000 | TOPICAL_OINTMENT | Freq: Two times a day (BID) | CUTANEOUS | 0 refills | Status: AC

## 2024-03-28 NOTE — ED Provider Notes (Signed)
 [name]    CSN: [id number] Arrival date & time: 03/28/24  1232      History   Chief Complaint Chief Complaint  Patient presents with   Rash    HPI Tony [name] is a 18 y.o. male.  Here with mom for 2-week history of fissure behind the right ear.  Area has scabs and sores appearing on the external ear. Mom has been using Neosporin Patient is also scratching and picking at areas He has fissures come and go very frequently behind the ears. 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   Gynecomastia 08/09/2013   Allergic rhinitis 08/09/2013   Body mass index, pediatric, greater than or equal to 95th percentile for age 70/13/2014   Developmental delay 08/09/2013    Past Surgical History:  Procedure Laterality Date   ADENOIDECTOMY     ADENOIDECTOMY     CIRCUMCISION  2006   TYMPANOPLASTY  11/17/2012   Procedure: TYMPANOPLASTY;  Surgeon: Lawence Press, MD;  Location: [facility];  Service: ENT;  Laterality: Right;  WITH GRAFT    TYMPANOPLASTY Left 04/12/2013   Procedure: LEFT TYMPANOPLASTY WITH TEMPORALIS FASCIA GRAFT ;  Surgeon: Lawence Press, MD;  Location: [facility];  Service: ENT;  Laterality: Left;   TYMPANOSTOMY TUBE PLACEMENT     x 2       Home Medications    Prior to Admission medications   Medication Sig Start Date End Date Taking? Authorizing Provider  doxycycline (VIBRAMYCIN) 100 MG capsule Take  1 capsule (100 mg total) by mouth 2 (two) times daily for 10 days. 03/28/24 04/07/24 Yes Rida Loudin, Ivette Marks, PA-C  mupirocin  ointment (BACTROBAN ) 2 % Apply 1 Application topically 2 (two) times daily. 03/28/24  Yes Keriann Rankin, Ivette Marks, PA-C  nystatin-triamcinolone  ointment (MYCOLOG) Apply 1 Application topically. 10/18/13  Yes [provider]  Acetaminophen  (TYLENOL  PO) Take by mouth as needed.    [provider]  cetirizine  (ZYRTEC ) 10 MG tablet Take one tablet by mouth daily at bedtime when needed for allergy  symptom control 06/12/23   [name], Angela J, MD  IBUPROFEN PO Take by mouth as needed.    [provider]    Family History Family History  Problem Relation Age of Onset   Asthma Mother    Hypertension Mother    Sickle cell trait Mother    Thyroid cancer Mother        thyroidectomy with resulting nerve damage to vocal cords   Anesthesia problems Mother        hx. of waking up during surgery   Asthma Brother    Sickle cell trait Brother    Diabetes Maternal Grandmother    Heart disease Maternal Grandmother        Died at 64   Other Maternal Grandfather  Died form ADRS   Diabetes Maternal Aunt     Social History Social History   Tobacco Use   Smoking status: Never    Passive exposure: Never   Smokeless tobacco: Never  Vaping Use   Vaping status: Never Used  Substance Use Topics   Alcohol use: No   Drug use: No     Allergies   Amoxicillin-pot clavulanate, Adhesive [tape], Latex, and Omnicef  [cefdinir ]   Review of Systems Review of Systems  Skin:  Positive for rash.   Per HPI  Physical Exam Triage Vital Signs ED Triage Vitals  Encounter Vitals Group     BP 03/28/24 1332 116/73     Systolic BP Percentile --      Diastolic BP Percentile --      Pulse Rate 03/28/24 1332 72     Resp 03/28/24 1332 18     Temp 03/28/24 1332 98.3 F (36.8 C)     Temp Source 03/28/24 1332 Oral     SpO2 03/28/24 1332 97 %     Weight 03/28/24 1328 216 lb  14.9 oz (98.4 kg)     Height --      Head Circumference --      Peak Flow --      Pain Score 03/28/24 1328 0     Pain Loc --      Pain Education --      Exclude from Growth Chart --    No data found.  Updated Vital Signs BP 116/73 (BP Location: Left Arm)   Pulse 72   Temp 98.3 F (36.8 C) (Oral)   Resp 18   Wt 216 lb 14.9 oz (98.4 kg)   SpO2 97%   BMI 33.98 kg/m    Physical Exam Vitals and nursing note reviewed.  Constitutional:      General: He is not in acute distress.    Appearance: Normal appearance.  HENT:     Right Ear: Tympanic membrane and ear canal normal.     Left Ear: Tympanic membrane and ear canal normal.     Ears:     Comments: Fissures and emaciated skin behind bilateral ears. Non tender. There are several lesions on the right external ear, a few are raw, erythematous, and open but 1-2 have some crusting.     Mouth/Throat:     Mouth: Mucous membranes are moist.     Pharynx: Oropharynx is clear. No posterior oropharyngeal erythema.  Eyes:     Conjunctiva/sclera: Conjunctivae normal.     Pupils: Pupils are equal, round, and reactive to light.  Cardiovascular:     Rate and Rhythm: Normal rate and regular rhythm.     Pulses: Normal pulses.     Heart sounds: Normal heart sounds.  Pulmonary:     Effort: Pulmonary effort is normal. No respiratory distress.     Breath sounds: Normal breath sounds. No wheezing.  Abdominal:     General: Bowel sounds are normal.     Palpations: Abdomen is soft.     Tenderness: There is no abdominal tenderness.  Musculoskeletal:        General: Normal range of motion.     Cervical back: Normal range of motion.  Neurological:     Mental Status: He is alert and oriented to person, place, and time.      UC Treatments / Results  Labs (all labs ordered are listed, but only abnormal results are displayed) Labs Reviewed - No data to display  EKG  Radiology No results found.  Procedures Procedures (including critical  care time)  Medications Ordered in UC Medications - No data to display  Initial Impression / Assessment and Plan / UC Course  I have reviewed the triage vital signs and the nursing notes.  Pertinent labs & imaging results that were available during my care of the patient were reviewed by me and considered in my medical decision making (see chart for details).  History of frequent fissuring behind the ears. Wonder if related to eczema. There are now sores and lesions on the right external ear. Suspect bacterial infection, possible impetigo. Patient has allergy  to augmentin and omnicef . Will treat for bacterial skin infection with doxycycline BID x 7 days. Also recommend mupirocin  to the areas topically. Mom asking about skin culture, discussed superficial culture not indicated and done in urgent care setting. I do recommend follow up with dermatology and primary care given the frequent fissures. Mom agrees to plan  Final Clinical Impressions(s) / UC Diagnoses   Final diagnoses:  Bacterial skin infection  Skin fissures  Right ear pain     Discharge Instructions      Please take medication as prescribed. Take with food to avoid upset stomach. Finish all the pills!  Use the bactroban  ointment twice daily for 1-2 weeks   ED Prescriptions     Medication Sig Dispense Auth. Provider   doxycycline (VIBRAMYCIN) 100 MG capsule Take 1 capsule (100 mg total) by mouth 2 (two) times daily for 10 days. 20 capsule Melvina Pangelinan, PA-C   mupirocin  ointment (BACTROBAN ) 2 % Apply 1 Application topically 2 (two) times daily. 30 g Parry Po, Ivette Marks, PA-C      PDMP not reviewed this encounter.   Creighton Doffing, PA-C 03/28/24 1437

## 2024-03-28 NOTE — ED Triage Notes (Addendum)
 Pt has a tear behind the ear and scabs and sores on the ear lobe since Mar 07, 2024. Mom treated the area with Neosporin but sxs did not improve. Pt is adamant that scratching caused the rash to spread.   Pt says he believes he has a tear behind his left ear as well.

## 2024-03-28 NOTE — Discharge Instructions (Addendum)
 Please take medication as prescribed. Take with food to avoid upset stomach. Finish all the pills!  Use the bactroban  ointment twice daily for 1-2 weeks

## 2024-04-07 ENCOUNTER — Ambulatory Visit (INDEPENDENT_AMBULATORY_CARE_PROVIDER_SITE_OTHER): Payer: Self-pay | Admitting: Psychology

## 2024-04-07 ENCOUNTER — Telehealth (INDEPENDENT_AMBULATORY_CARE_PROVIDER_SITE_OTHER): Payer: Self-pay | Admitting: Psychology

## 2024-04-07 NOTE — Telephone Encounter (Signed)
 Who's calling (name and relationship to patient) : Tony [name]; mom  Best contact number: [phone]  Provider they see: Dator, DO  Reason for call: Mom called in to rs, the next available will not work, due to Siegert will be in summer school starting 6/24 from 9 am to 1. She stated she will need an afternoon appt, and requested for Dator to give her a call back.    Call ID:      PRESCRIPTION REFILL ONLY  Name of prescription:  Pharmacy:

## 2024-04-08 NOTE — Telephone Encounter (Signed)
 Mom called again regarding previous encounter, she would like a call back regarding appt from Dorothy. [phone]

## 2024-04-14 ENCOUNTER — Telehealth (INDEPENDENT_AMBULATORY_CARE_PROVIDER_SITE_OTHER): Payer: Self-pay | Admitting: Psychology

## 2024-04-14 NOTE — Telephone Encounter (Signed)
 Aichatou called from the patient's PCP requesting his completed CCA. It can be faxed to [phone] and ATTN to Aichatou.   She called back again for his CCA. I did let her know that your last note to me was that it was not completed.

## 2024-04-21 ENCOUNTER — Ambulatory Visit (INDEPENDENT_AMBULATORY_CARE_PROVIDER_SITE_OTHER): Payer: Self-pay | Admitting: Psychology

## 2024-04-22 ENCOUNTER — Ambulatory Visit: Payer: MEDICAID | Admitting: Pediatrics

## 2024-04-22 ENCOUNTER — Ambulatory Visit (INDEPENDENT_AMBULATORY_CARE_PROVIDER_SITE_OTHER): Payer: MEDICAID | Admitting: Psychology

## 2024-04-22 DIAGNOSIS — F338 Other recurrent depressive disorders: Secondary | ICD-10-CM | POA: Diagnosis not present

## 2024-04-22 DIAGNOSIS — F639 Impulse disorder, unspecified: Secondary | ICD-10-CM | POA: Diagnosis not present

## 2024-04-22 DIAGNOSIS — R4689 Other symptoms and signs involving appearance and behavior: Secondary | ICD-10-CM | POA: Diagnosis not present

## 2024-04-23 NOTE — Progress Notes (Signed)
 Tony [name] was seen for a testing session by request of Rosaline Benne, NP due to concerns related to becoming easily angered and agitated, aggression/cursing directed at his mother, central auditory processing disorder, lack of interest in peers and activities once enjoyed, significant change in affect and behavior that occurred around first grade, decreased use of speech and language, developmental delays over time, sensory processing difficulties, sensitivities to sound, and suspicion of autism spectrum disorder (ASD).     The testing session was conducted Face to Face . Of note, the primary language spoken at home is [place].   Biological Sex: male  Preferred pronouns:  he / him  Start Time:   3:40 PM  End Time:   5:40 PM   Provider/Observer:  Naomie HERO. Everlene Cunning, Radiographer, therapeutic  Reason for Service: Psychological Assessment     Behavioral Observations: Romyn presents as a 18 y.o.-year-old, African American, male,  who appeared to be his stated age. His behavior was atypical for an adolescent of his age. Spoken language was consisted of few words/phrase speech but was clear and easy to understand and the examiner noted that rhythm of speech was normal, while rate was somewhat fast, and intonation was flat. There were not any physical disabilities noted and Ciel displayed decreased level level of cooperation and motivation.  Pt was not taking prescribed medication at the time of this appointment. Overall, pt's behaviors during testing suggest that these results should be interpreted with caution due to possible lack of motivation and effort.   Mental status exam        Orientation: oriented to time, place, and person                   Attention: attention span appeared shorter than expected for age        Mood/Affect: Pt appeared to be irritable and affect was flat                   Physical Appearance:no concerns about hygeine   Assessment:  During the present  appointment, Aren did the MMPI-3. While he was completing the test items, he sighed and asked the examiner on a few occasions and asked how long the appointment was going to take. He also expressed annoyance or frustration when the examiner asked him to put his phone away during the assessment.   Plan: During today's appointment, in-person testing took place. Examiner administered the MMPI-3. Additionally, clinician ensured that rating scales have been completed, including the ASRS, BASC-3, and [place] 4. Pt's mother has not yet finished the [place] 3, but will complete it and then reach out to clinician about scheduling follow up / feedback appointment. The testing plan has been discussed with the parent who expressed understanding.   Impression/Diagnosis:  Depressive Disorder (possible)  Autism Spectrum Disorder (possible)    Naomie Earnie Livers,  [facility] Provisionally Licensed Psychologist [phone]  [facility] Development & [facility] [address] [place], [address]  [place], [place] [address] Phone: [phone]

## 2024-04-26 ENCOUNTER — Ambulatory Visit: Payer: MEDICAID | Admitting: Pediatrics

## 2024-04-28 ENCOUNTER — Telehealth (INDEPENDENT_AMBULATORY_CARE_PROVIDER_SITE_OTHER): Payer: MEDICAID | Admitting: Pediatrics

## 2024-04-28 DIAGNOSIS — K0889 Other specified disorders of teeth and supporting structures: Secondary | ICD-10-CM

## 2024-04-28 DIAGNOSIS — H9325 Central auditory processing disorder: Secondary | ICD-10-CM

## 2024-04-28 DIAGNOSIS — H9193 Unspecified hearing loss, bilateral: Secondary | ICD-10-CM | POA: Diagnosis not present

## 2024-04-28 DIAGNOSIS — R599 Enlarged lymph nodes, unspecified: Secondary | ICD-10-CM

## 2024-04-28 DIAGNOSIS — R4689 Other symptoms and signs involving appearance and behavior: Secondary | ICD-10-CM | POA: Diagnosis not present

## 2024-04-28 DIAGNOSIS — L219 Seborrheic dermatitis, unspecified: Secondary | ICD-10-CM | POA: Diagnosis not present

## 2024-04-28 NOTE — Progress Notes (Signed)
 Subjective:    Patient ID: Tony [name], male    DOB: [birth year], 18 y.o.   MRN: [id number]  Virtual Visit via Video Note  I connected with Tony [name] and his mother  on 04/28/24 at  4:15 PM EDT by a video enabled telemedicine application and verified that I am speaking with the correct person using two identifiers.  Darrly gives permission to direct questions primarily to his mother and involve him when needed. Location of patient/parent: [place]   I discussed the limitations of evaluation and management by telemedicine and the availability of in person appointments.  I advised the mother  that by engaging in this telehealth visit, they consent to the provision of healthcare.  Additionally, they authorize for the patient's insurance to be billed for the services provided during this telehealth visit.  They expressed understanding and agreed to proceed.  Reason for visit:  Chief Complaint  Patient presents with   Follow-up    Mom is concerned about behaviors. Sensory disorders. Mom wants to talk about personal habits that the pt does. Also wants another hearing test. Wasn't satisfied with the last appointment. Needs dental surgery. Experiencing pain in mouth and swelling. Concerned with tears behind ears.      History of Present Illness: Tony [name] is an 18 yrs old young man with multiple health concerns.    He is being evaluated for Autism Spectrum Disorder with Developmental P[facility] and mom states the final report from the psychologist is pending.  Tony [name] needs the evaluation finalized to proceed with mother's application for guardianship and to determine future decisions on disability and financial support, insurance and more. He is attending summer school and this ends July 25th; doing well.  Graduation is set for August and he will have a final IEP meeting before graduation. Mom states he continues with sensory issues and behavior concerns.  He did permit mom to cut his hair  this spring and that has helped with hygiene.  Still not sure about why he places paper in the back of his underwear but thinks maybe due to perspiration (previously examined and no problem seen in office).  Has the [employer] powder at home but not sure how often he uses it. Mom states continued attention to his diet within her best control because she notices red dye does not agree with him, leading to behavior concerns. They continue with counseling with [facility] by phone each Thursday.  Mom states continued concern about his ears and drainage.  Tony [name] was seen by ENT Sept 2024 (Dr. Spainhour).  Chart review shows he was treated with debridement and ciprodex .  Mom states concern his hearing was not checked at that visit and she continues with concern.  She states preference to be referred back to audiology instead of ENT follow up, citing Tony [name] is more comfortable with the audiology team and cooperates/interacts better there.  He is having dental pain for the past 3 days and mom is concerned he has significant decay - does not brush well due to sensory reaction.  She states preference to see a dentist in [place], adding she has the contact information and ability to follow through.  Other acute concerns include a knot noted at the back of his head/neck area.  Not red or painful.  No fever. May have appeared after his haircut. He has recurring problem with crusting behind his ears (more on right) and areas can get cracked and drain.  Used mupirocin  and it has  improved with no further cracks or drainage.  No other meds or modifying factors. They continue in temporary housing at the hotel.  Mom states her older son Tony [name] did visit with them and this was pleasant.  PMH, problem list, medications and allergies, family and social history reviewed and updated as indicated.    Observations/Objective: Styles is seen in the room on camera and speaks with this physician and answers questions.   He initially places a covering over his nose and mouth but removes as needed to allow visualization of his skin. His occipital and posterior auricular hairline is viewed and neat with no crusting, redness or flakes seen to that area of his scalp.   There is a small round appearing knot in the right posterior occipital area.  Mom manipulates it and it is observed as mobile and she reports it as firm.  [name] does not resist manipulation and does not voice pain. The right ear is seen with dry skin and post-inflammatory hypopigmentation; no breaks in skin seen, no bleeding or oozing  Assessment and Plan: 1. Behavior causing concern in biological child (Primary) I spoke with mom about the information visible in EHR from Dr. Dator that some other test/screens are needed to complete diagnosis. Advised mom to follow up and that we can reach out to Dr. Jobie office if no contact with mom in the next couple of weeks - office closed for the 4th and potentially for tomorrow or some of the surrounding holiday times.  Tony [name] continues to present with behavior concerning for ASD and potentially other comorbidity.  This affects his ability to manage his decision making for financial, medical, social safety and he has signed for this office to communicate with his mom on his behalf.  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  2. Central auditory processing disorder Existing diagnosis.  3. Hearing problem of both ears Referral placed; will follow up with ENT if indicated. - Ambulatory referral to Audiology  4. Seborrheic dermatitis Discussed with mom history provided by her and the residual findings on exam today are most c/w recurring seborrheic dermatitis with cracks developing when inflammation proceeds. Looks ok now but discussed use of HC cream when flare-ups occur to prevent proceeding  to cracks and weeping.  Mom voiced understanding and ability to follow through. - hydrocortisone  2.5 % cream; Apply to flare-ups of seborrhea behind ears bid when needed up to 5 days  Dispense: 30 g; Refill: 0  5. Reactive lymphadenopathy Knot on back of neck most c/w reactive lymphadenopathy - likely related to his hair cut.  No intervention needed at this time.  Reviewed S/S requiring follow up including enlargement, pain, overlying redness, fever or other concerns.  Mom voiced agreement with this plan and agreement to follow through.  6. Pain, dental Mom states she already has dentist in mind and will make contact.  Advised her to contact us  if needed.  He needs dentist who attends to adult population and our current list is of pediatric dentists with some family dentists.   Follow Up Instructions: prn and in 3 months.  Will contact mom to set follow up once evaluation for ASD is completed.   I discussed the assessment and treatment plan with the patient and/or parent/guardian. They were provided an opportunity to ask questions and all were answered. They agreed with the plan and demonstrated an understanding of the instructions.  They were advised to call back or seek an in-person evaluation in the emergency room if the symptoms worsen or if the condition fails to improve as anticipated.  Time spent reviewing chart in preparation for visit:  5 minutes Time spent face-to-face with patient: 38 minutes Time spent not face-to-face with patient for documentation and care coordination on date of service: 5 minutes  I was located at [facility] during this encounter.  Jon JINNY Bars, MD

## 2024-05-16 ENCOUNTER — Encounter: Payer: Self-pay | Admitting: Pediatrics

## 2024-05-16 MED ORDER — HYDROCORTISONE 2.5 % EX CREA
TOPICAL_CREAM | CUTANEOUS | 0 refills | Status: AC
Start: 2024-05-16 — End: ?

## 2024-06-24 ENCOUNTER — Other Ambulatory Visit: Payer: Self-pay | Admitting: Pediatrics

## 2024-06-24 DIAGNOSIS — J302 Other seasonal allergic rhinitis: Secondary | ICD-10-CM

## 2024-08-01 ENCOUNTER — Emergency Department (HOSPITAL_COMMUNITY): Payer: MEDICAID

## 2024-08-01 ENCOUNTER — Other Ambulatory Visit: Payer: Self-pay

## 2024-08-01 ENCOUNTER — Emergency Department (HOSPITAL_COMMUNITY)
Admission: EM | Admit: 2024-08-01 | Discharge: 2024-08-01 | Disposition: A | Discharge status: Home / Self Care | Payer: MEDICAID | Attending: Emergency Medicine | Admitting: Emergency Medicine

## 2024-08-01 ENCOUNTER — Encounter (HOSPITAL_COMMUNITY): Payer: Self-pay

## 2024-08-01 DIAGNOSIS — K047 Periapical abscess without sinus: Secondary | ICD-10-CM | POA: Diagnosis not present

## 2024-08-01 DIAGNOSIS — D72829 Elevated white blood cell count, unspecified: Secondary | ICD-10-CM | POA: Diagnosis not present

## 2024-08-01 DIAGNOSIS — Z91040 Latex allergy status: Secondary | ICD-10-CM | POA: Diagnosis not present

## 2024-08-01 DIAGNOSIS — R220 Localized swelling, mass and lump, head: Secondary | ICD-10-CM | POA: Diagnosis present

## 2024-08-01 LAB — CBC WITH DIFFERENTIAL/PLATELET
Abs Immature Granulocytes: 0.06 K/uL (ref 0.00–0.07)
Basophils Absolute: 0 K/uL (ref 0.0–0.1)
Basophils Relative: 0 %
Eosinophils Absolute: 0.1 K/uL (ref 0.0–0.5)
Eosinophils Relative: 1 %
HCT: 46 % (ref 39.0–52.0)
Hemoglobin: 15.4 g/dL (ref 13.0–17.0)
Immature Granulocytes: 0 %
Lymphocytes Relative: 9 %
Lymphs Abs: 1.5 K/uL (ref 0.7–4.0)
MCH: 28.1 pg (ref 26.0–34.0)
MCHC: 33.5 g/dL (ref 30.0–36.0)
MCV: 83.8 fL (ref 80.0–100.0)
Monocytes Absolute: 1.8 K/uL — ABNORMAL HIGH (ref 0.1–1.0)
Monocytes Relative: 11 %
Neutro Abs: 13 K/uL — ABNORMAL HIGH (ref 1.7–7.7)
Neutrophils Relative %: 79 %
Platelets: 456 K/uL — ABNORMAL HIGH (ref 150–400)
RBC: 5.49 MIL/uL (ref 4.22–5.81)
RDW: 13 % (ref 11.5–15.5)
WBC: 16.4 K/uL — ABNORMAL HIGH (ref 4.0–10.5)
nRBC: 0 % (ref 0.0–0.2)

## 2024-08-01 LAB — BASIC METABOLIC PANEL WITH GFR
Anion gap: 11 (ref 5–15)
BUN: <5 mg/dL — ABNORMAL LOW (ref 6–20)
CO2: 23 mmol/L (ref 22–32)
Calcium: 9 mg/dL (ref 8.9–10.3)
Chloride: 102 mmol/L (ref 98–111)
Creatinine, Ser: 0.87 mg/dL (ref 0.61–1.24)
GFR, Estimated: >60 mL/min (ref >60)
Glucose, Bld: 100 mg/dL — ABNORMAL HIGH (ref 70–99)
Potassium: 3.7 mmol/L (ref 3.5–5.1)
Sodium: 136 mmol/L (ref 135–145)

## 2024-08-01 MED ORDER — CLINDAMYCIN PHOSPHATE 600 MG/50ML IV SOLN
600.0000 mg | Freq: Once | INTRAVENOUS | Status: AC
  Administered 2024-08-01: 600 mg via INTRAVENOUS
  Filled 2024-08-01: qty 50

## 2024-08-01 MED ORDER — OXYCODONE HCL 5 MG PO TABS: 5.0000 mg | ORAL_TABLET | ORAL | 0 refills | Status: DC | PRN

## 2024-08-01 MED ORDER — CLINDAMYCIN HCL 150 MG PO CAPS: 300.0000 mg | ORAL_CAPSULE | Freq: Once | ORAL | Status: DC

## 2024-08-01 MED ORDER — OXYCODONE-ACETAMINOPHEN 5-325 MG PO TABS
1.0000 | ORAL_TABLET | Freq: Once | ORAL | Status: AC
  Administered 2024-08-01: 1 via ORAL
  Filled 2024-08-01: qty 1

## 2024-08-01 MED ORDER — MORPHINE SULFATE (PF) 2 MG/ML IV SOLN
2.0000 mg | Freq: Once | INTRAVENOUS | Status: AC
  Administered 2024-08-01: 2 mg via INTRAVENOUS
  Filled 2024-08-01: qty 1

## 2024-08-01 MED ORDER — CLINDAMYCIN HCL 300 MG PO CAPS: 300.0000 mg | ORAL_CAPSULE | Freq: Four times a day (QID) | ORAL | 0 refills | Status: AC

## 2024-08-01 MED ORDER — IOHEXOL 350 MG/ML SOLN
75.0000 mL | Freq: Once | INTRAVENOUS | Status: AC | PRN
  Administered 2024-08-01: 75 mL via INTRAVENOUS

## 2024-08-01 NOTE — ED Triage Notes (Signed)
 Patient has right sided facial swelling from a possible infected tooth.

## 2024-08-01 NOTE — Discharge Instructions (Addendum)
 You were seen for your dental pain in the emergency department.   At home, please take Tylenol  and ibuprofen for your pain.  You may also take the oxycodone  we have prescribed you for any breakthrough pain that may have.  Do not take this before driving or operating heavy machinery.  Do not take this medication with alcohol.  Take the antibiotics (clindamycin) to treat any infection.  Follow-up with a dentist in 2-3 days regarding your visit.    Return immediately to the emergency department if you experience any of the following: Worsening pain, fever, difficulty swallowing, difficulty breathing, or any other concerning symptoms.    Thank you for visiting our Emergency Department. It was a pleasure taking care of you today.

## 2024-08-01 NOTE — ED Provider Notes (Signed)
 [facility] EMERGENCY DEPARTMENT AT [facility] Provider Note   CSN: [id number] Arrival date & time: 08/01/24  1058     Patient presents with: Facial Swelling   Tony [name] is a 18 y.o. male.  19 year old male presents ED with complaints of facial swelling noted since Friday.  Family advises they noticed swelling started Friday he is not compliant with dental visits and bad molars.  They advised there is not an official diagnosis of dental decay.  Family advises they have tried ibuprofen at home without relief and she had leftover doxycycline  that she has given him 500 mg for the last 2 days.  Patient advises he has been able to drink liquids but has not eaten since Friday.  He advises he is still able to swallow at home but he is hesitant to eat due to potential pain.  Patient has used Zofran  at home for nausea with relief.    Prior to Admission medications   Medication Sig Start Date End Date Taking? Authorizing Provider  clindamycin (CLEOCIN) 300 MG capsule Take 1 capsule (300 mg total) by mouth every 6 (six) hours for 7 days. 08/01/24 08/08/24 Yes Tony Lamar BROCKS, MD  oxyCODONE  (ROXICODONE ) 5 MG immediate release tablet Take 1 tablet (5 mg total) by mouth every 4 (four) hours as needed. 08/01/24  Yes Tony Lamar BROCKS, MD  cetirizine  (ZYRTEC ) 10 MG tablet TAKE ONE TABLET BY MOUTH DAILY AT BEDTIME WHEN NEEDED FOR ALLERGY  SYMPTOM CONTROL 06/25/24   Tony Jon PARAS, MD  hydrocortisone  2.5 % cream Apply to flare-ups of seborrhea behind ears bid when needed up to 5 days 05/16/24   [name], Angela J, MD  IBUPROFEN PO Take by mouth as needed.    [provider]  mupirocin  ointment (BACTROBAN ) 2 % Apply 1 Application topically 2 (two) times daily. 03/28/24   Rising, Tony [name]    Allergies: Amoxicillin-pot clavulanate, Adhesive [tape], Latex, and Omnicef  [cefdinir ]    Review of Systems  Constitutional:  Positive for activity change and fever.  HENT:  Positive for  dental problem and facial swelling.   All other systems reviewed and are negative.   Updated Vital Signs BP 129/85   Pulse 74   Temp 99.8 F (37.7 C) (Oral)   Resp 20   Ht 5' 7 (1.702 m)   Wt 98 kg   SpO2 100%   BMI 33.83 kg/m   Physical Exam Vitals and nursing note reviewed.  Constitutional:      Appearance: Normal appearance.  HENT:     Head: Normocephalic and atraumatic.     Nose: Nose normal.     Mouth/Throat:     Mouth: Mucous membranes are moist.     Pharynx: No oropharyngeal exudate or posterior oropharyngeal erythema.     Tonsils: No tonsillar exudate or tonsillar abscesses.  Eyes:     Extraocular Movements: Extraocular movements intact.     Conjunctiva/sclera: Conjunctivae normal.     Pupils: Pupils are equal, round, and reactive to light.  Cardiovascular:     Rate and Rhythm: Normal rate.     Heart sounds: Normal heart sounds.  Pulmonary:     Effort: Pulmonary effort is normal. No respiratory distress.     Breath sounds: Normal breath sounds.  Abdominal:     Tenderness: There is no guarding.  Musculoskeletal:        General: Normal range of motion.     Cervical back: Normal range of motion.  Lymphadenopathy:  Cervical: Cervical adenopathy present.  Skin:    General: Skin is warm.     Capillary Refill: Capillary refill takes less than 2 seconds.  Neurological:     General: No focal deficit present.     Mental Status: He is alert.  Psychiatric:        Mood and Affect: Mood normal.        Behavior: Behavior normal.     (all labs ordered are listed, but only abnormal results are displayed) Labs Reviewed  CBC WITH DIFFERENTIAL/PLATELET - Abnormal; Notable for the following components:      Result Value   WBC 16.4 (*)    Platelets 456 (*)    Neutro Abs 13.0 (*)    Monocytes Absolute 1.8 (*)    All other components within normal limits  BASIC METABOLIC PANEL WITH GFR - Abnormal; Notable for the following components:   Glucose, Bld 100 (*)     BUN <5 (*)    All other components within normal limits    EKG: None  Radiology: CT Soft Tissue Neck W Contrast Result Date: 08/01/2024 CLINICAL DATA:  Provided history: Soft tissue infection suspected, neck, x-ray done. Additional history provided: Right-sided facial swelling, possible infected tooth. EXAM: CT NECK WITH CONTRAST TECHNIQUE: Multidetector CT imaging of the neck was performed using the standard protocol following the bolus administration of intravenous contrast. RADIATION DOSE REDUCTION: This exam was performed according to the departmental dose-optimization program which includes automated exposure control, adjustment of the mA and/or kV according to patient size and/or use of iterative reconstruction technique. CONTRAST:  75mL OMNIPAQUE IOHEXOL 350 MG/ML SOLN COMPARISON:  None. FINDINGS: Generalized suboptimal contrast enhancement limits evaluation. Within this limitation, findings are as follows. Pharynx and larynx: No appreciable swelling or mass within the oral cavity, pharynx or larynx. No retropharyngeal collection. Salivary glands: No inflammation, mass, or stone. Thyroid: Unremarkable. Lymph nodes: No pathologically enlarged lymph nodes identified. Vascular: Limited assessment of the major vascular structures of the neck due to generalized suboptimal contrast enhancement. Limited intracranial: No evidence of an acute intracranial abnormality within the field of view. Visualized orbits: No orbital mass or acute orbital finding. Mastoids and visualized paranasal sinuses: Mild mucosal thickening within the left maxillary sinus. Small-volume secretions within the left sphenoid sinus. No significant mastoid effusion. Skeleton: Nonspecific reversal of the expected cervical lordosis. Dental findings as described below. Upper chest: No consolidation within the imaged lung apices. Other: Streak/beam hardening artifact arising from dental restoration. Poor dentition with multifocal dental and  periodontal disease. In the region of interest, there is periapical lucency surrounding the right mandibular second molar tooth (for instance as seen on series 5, image 93). Adjacent prominent edema/swelling centered along the body of the mandible on the right (for instance as seen on series 3, image 77). This is compatible with soft tissue infection/cellulitis. There is no appreciable abscess, however, the examination is limited by generalized suboptimal contrast enhancement. Asymmetric prominence of the right masseter consistent with myositis. IMPRESSION: 1. Prominent edema/swelling centered along the body of the mandible on the right consistent with soft tissue infection/cellulitis. There is no appreciable abscess at this site, however, assessment is limited by generalized suboptimal contrast enhancement. Asymmetric prominence of the right masseter consistent with myositis. Findings are likely odontogenic in origin. There is prominent periapical lucency (consistent periodontal disease) surrounding the adjacent right mandibular second molar tooth. 2. Background poor dentition with multifocal dental and periodontal disease. 3. Minor paranasal sinus disease as described. Electronically Signed   By:  Rockey Childs D.O.   On: 08/01/2024 15:28     Procedures   Medications Ordered in the ED  morphine  (PF) 2 MG/ML injection 2 mg (2 mg Intravenous Given 08/01/24 1411)  iohexol (OMNIPAQUE) 350 MG/ML injection 75 mL (75 mLs Intravenous Contrast Given 08/01/24 1429)  clindamycin (CLEOCIN) IVPB 600 mg (0 mg Intravenous Stopped 08/01/24 1624)  oxyCODONE -acetaminophen  (PERCOCET/ROXICET) 5-325 MG per tablet 1 tablet (1 tablet Oral Given 08/01/24 1620)    18 y.o. male presents to the ED with complaints of right sided facial swelling, this involves an extensive number of treatment options, and is a complaint that carries with it a high risk of complications and morbidity.  The differential diagnosis includes with angina,  tonsillar abscess, dental abscess, soft tissue infection, sepsis, (Ddx)  On arrival pt is nontoxic, vitals unremarkable. Exam significant for obvious right-sided facial swelling with some lymphadenopathy  Additional history obtained from chart review significant for patient being followed by pediatrics for routine health maintenance and concerning behaviors at home as well.  I ordered medication morphine  oxycodone  and clindamycin for pain and treatment for bacterial infection  Lab Tests:  I Ordered, reviewed, and interpreted labs, which included: BMP, CBC  Imaging Studies ordered:  I ordered imaging studies which included CT soft tissue, I independently visualized and interpreted imaging which showed swelling and edema along the right mandible.  ED Course:   18 year old male presents to ED with complaints of right sided facial swelling since Friday.  Full HPI above.  Mother is at bedside and is main historian.  On exam patient has obvious right-sided facial swelling near the right mandible.  On palpation there is no obvious areas of fluctuance.  No obvious masses.  There is significant soft tissue swelling.  On oral exam there is poor mentation with obvious plaque buildup throughout no obvious signs of decay.  No obvious signs of erythema or edema.  No exudates noted throughout.  No signs of pus or infected tooth.  There is no pain to palpation of the gums.  Patient has significant pain to palpation around right jaw and cheek where the swelling is.  Patient is able to drink liquids and reports he has no issues with swallowing or talking.  He has not eaten since Friday due to anticipation of pain.  Lungs are clear to auscultation all fields and patient is not in any obvious distress at bedside.  Patient is afebrile.  CBC was significant for leukocytosis and there is some significant swelling to the right jaw.  CT soft tissue will be ordered.  CT soft tissue was concerning for myositis.  It noted it  was most likely odontogenic in nature.  It was discussed with attending and advised to start on clindamycin here with pain management.  Patient was given outpatient clindamycin and pain management and advised to follow-up with dentist for further management.  Portions of this note were generated with Scientist, clinical (histocompatibility and immunogenetics). Dictation errors may occur despite best attempts at proofreading.   Final diagnoses:  Dental infection    ED Discharge Orders          Ordered    clindamycin (CLEOCIN) 300 MG capsule  Every 6 hours        08/01/24 1535    oxyCODONE  (ROXICODONE ) 5 MG immediate release tablet  Every 4 hours PRN        08/01/24 1537               Tony Kleve S, [name] 08/01/24  2213    Tony Lamar BROCKS, MD 08/06/24 [phone]

## 2024-08-06 ENCOUNTER — Emergency Department (HOSPITAL_COMMUNITY)
Admission: EM | Admit: 2024-08-06 | Discharge: 2024-08-06 | Disposition: A | Discharge status: Home / Self Care | Payer: MEDICAID

## 2024-08-06 ENCOUNTER — Other Ambulatory Visit: Payer: Self-pay

## 2024-08-06 ENCOUNTER — Emergency Department (HOSPITAL_COMMUNITY): Payer: MEDICAID

## 2024-08-06 DIAGNOSIS — K047 Periapical abscess without sinus: Secondary | ICD-10-CM | POA: Insufficient documentation

## 2024-08-06 DIAGNOSIS — R945 Abnormal results of liver function studies: Secondary | ICD-10-CM | POA: Diagnosis not present

## 2024-08-06 DIAGNOSIS — R7989 Other specified abnormal findings of blood chemistry: Secondary | ICD-10-CM

## 2024-08-06 DIAGNOSIS — Z91040 Latex allergy status: Secondary | ICD-10-CM | POA: Insufficient documentation

## 2024-08-06 DIAGNOSIS — R1084 Generalized abdominal pain: Secondary | ICD-10-CM | POA: Insufficient documentation

## 2024-08-06 DIAGNOSIS — R112 Nausea with vomiting, unspecified: Secondary | ICD-10-CM | POA: Insufficient documentation

## 2024-08-06 DIAGNOSIS — R109 Unspecified abdominal pain: Secondary | ICD-10-CM | POA: Diagnosis present

## 2024-08-06 LAB — CBC
HCT: 40.5 % (ref 39.0–52.0)
Hemoglobin: 13.7 g/dL (ref 13.0–17.0)
MCH: 27.6 pg (ref 26.0–34.0)
MCHC: 33.8 g/dL (ref 30.0–36.0)
MCV: 81.5 fL (ref 80.0–100.0)
Platelets: 474 K/uL — ABNORMAL HIGH (ref 150–400)
RBC: 4.97 MIL/uL (ref 4.22–5.81)
RDW: 12.9 % (ref 11.5–15.5)
WBC: 16.6 K/uL — ABNORMAL HIGH (ref 4.0–10.5)
nRBC: 0 % (ref 0.0–0.2)

## 2024-08-06 LAB — COMPREHENSIVE METABOLIC PANEL WITH GFR
ALT: 126 U/L — ABNORMAL HIGH (ref 0–44)
AST: 226 U/L — ABNORMAL HIGH (ref 15–41)
Albumin: 3.7 g/dL (ref 3.5–5.0)
Alkaline Phosphatase: 96 U/L (ref 38–126)
Anion gap: 12 (ref 5–15)
BUN: 6 mg/dL (ref 6–20)
CO2: 22 mmol/L (ref 22–32)
Calcium: 9 mg/dL (ref 8.9–10.3)
Chloride: 101 mmol/L (ref 98–111)
Creatinine, Ser: 0.84 mg/dL (ref 0.61–1.24)
GFR, Estimated: >60 mL/min (ref >60)
Glucose, Bld: 114 mg/dL — ABNORMAL HIGH (ref 70–99)
Potassium: 3.6 mmol/L (ref 3.5–5.1)
Sodium: 135 mmol/L (ref 135–145)
Total Bilirubin: 0.8 mg/dL (ref 0.0–1.2)
Total Protein: 7.2 g/dL (ref 6.5–8.1)

## 2024-08-06 LAB — ACETAMINOPHEN LEVEL: Acetaminophen (Tylenol), Serum: <10 ug/mL — ABNORMAL LOW (ref 10–30)

## 2024-08-06 LAB — URINALYSIS, ROUTINE W REFLEX MICROSCOPIC
Bilirubin Urine: NEGATIVE
Glucose, UA: NEGATIVE mg/dL
Hgb urine dipstick: NEGATIVE
Ketones, ur: 20 mg/dL — AB
Leukocytes,Ua: NEGATIVE
Nitrite: NEGATIVE
Protein, ur: 30 mg/dL — AB
Specific Gravity, Urine: 1.03 (ref 1.005–1.030)
pH: 6 (ref 5.0–8.0)

## 2024-08-06 LAB — LIPASE, BLOOD: Lipase: 20 U/L (ref 11–51)

## 2024-08-06 MED ORDER — ONDANSETRON 4 MG PO TBDP: 4.0000 mg | ORAL_TABLET | Freq: Three times a day (TID) | ORAL | 0 refills | Status: AC | PRN

## 2024-08-06 MED ORDER — SODIUM CHLORIDE 0.9 % IV BOLUS
1000.0000 mL | Freq: Once | INTRAVENOUS | Status: AC
  Administered 2024-08-06: 1000 mL via INTRAVENOUS

## 2024-08-06 MED ORDER — METOCLOPRAMIDE HCL 5 MG/ML IJ SOLN
5.0000 mg | Freq: Once | INTRAMUSCULAR | Status: AC
  Administered 2024-08-06: 5 mg via INTRAVENOUS
  Filled 2024-08-06: qty 2

## 2024-08-06 MED ORDER — PROMETHAZINE HCL 25 MG RE SUPP: 25.0000 mg | Freq: Three times a day (TID) | RECTAL | 0 refills | Status: AC | PRN

## 2024-08-06 MED ORDER — OXYCODONE HCL 5 MG PO TABS: 5.0000 mg | ORAL_TABLET | Freq: Four times a day (QID) | ORAL | 0 refills | Status: AC | PRN

## 2024-08-06 MED ORDER — ONDANSETRON 4 MG PO TBDP
4.0000 mg | ORAL_TABLET | Freq: Once | ORAL | Status: AC
  Administered 2024-08-06: 4 mg via ORAL
  Filled 2024-08-06: qty 1

## 2024-08-06 NOTE — Discharge Instructions (Addendum)
 Please follow-up with your primary care doctor in 2 to 3 days to recheck symptoms.  Your lab work does show some mildly elevated liver enzymes but your ultrasound of liver and gallbladder are normal.  I would recommend Zofran  as needed for nausea or vomiting.  Make sure staying well-hydrated with primarily water but you can alternate Pedialyte or Gatorade.  Return to ER with new or worsening symptoms.  Please have liver enzymes rechecked with primary care.

## 2024-08-06 NOTE — ED Provider Notes (Signed)
 [place] [facility] AT [facility] Provider Note   CSN: [id number] Arrival date & time: 08/06/24  0220     Patient presents with: Abdominal Pain   Tony [name] is a 18 y.o. male with past medical history of poor dentition, developmental delay presents to emergency room with complaint of abdominal pain.  Patient reports that abdominal pain has been ongoing for about 5 hours and he locates it to epigastric area.  It is associated with nausea and vomiting.  Prior to my evaluation his symptoms had resolved primarily on their own.  He is now tolerating drinking some water.  He does note that he was recently diagnosed with dental abscess and was started on clindamycin for this.  His dental pain and swelling has improved.  He has not had fever.  He has not had diarrhea.  He has handling secretions and has normal phonation.    Abdominal Pain      Prior to Admission medications   Medication Sig Start Date End Date Taking? Authorizing Provider  ondansetron  (ZOFRAN -ODT) 4 MG disintegrating tablet Take 1 tablet (4 mg total) by mouth every 8 (eight) hours as needed for nausea or vomiting. 08/06/24  Yes Emelyn Roen, Warren SAILOR, PA-C  promethazine (PHENERGAN) 25 MG suppository Place 1 suppository (25 mg total) rectally every 8 (eight) hours as needed for refractory nausea / vomiting. 08/06/24  Yes Cristi Gwynn, Warren SAILOR, PA-C  cetirizine  (ZYRTEC ) 10 MG tablet TAKE ONE TABLET BY MOUTH DAILY AT BEDTIME WHEN NEEDED FOR ALLERGY  SYMPTOM CONTROL 06/25/24   Taft Jon PARAS, MD  clindamycin (CLEOCIN) 300 MG capsule Take 1 capsule (300 mg total) by mouth every 6 (six) hours for 7 days. 08/01/24 08/08/24  Yolande Lamar BROCKS, MD  hydrocortisone  2.5 % cream Apply to flare-ups of seborrhea behind ears bid when needed up to 5 days 05/16/24   [name], Angela J, MD  IBUPROFEN PO Take by mouth as needed.    [provider]  mupirocin  ointment (BACTROBAN ) 2 % Apply 1 Application topically 2 (two)  times daily. 03/28/24   Rising, Asberry, PA-C  oxyCODONE  (ROXICODONE ) 5 MG immediate release tablet Take 1 tablet (5 mg total) by mouth every 6 (six) hours as needed for severe pain (pain score 7-10) or breakthrough pain. 08/06/24   Ysenia Filice, Warren SAILOR, PA-C    Allergies: Amoxicillin-pot clavulanate, Adhesive [tape], Latex, and Omnicef  [cefdinir ]    Review of Systems  Gastrointestinal:  Positive for abdominal pain.    Updated Vital Signs BP 120/60 (BP Location: Left Arm)   Pulse 75   Temp 98.6 F (37 C) (Oral)   Resp 17   SpO2 100%   Physical Exam Vitals and nursing note reviewed.  Constitutional:      General: He is not in acute distress.    Appearance: He is not toxic-appearing.  HENT:     Head: Normocephalic and atraumatic.     Comments: Mild right sided facial swelling.  Handling secretions with normal phonation.  No anterior neck swelling.  Uvula midline, no uvula swelling. Eyes:     General: No scleral icterus.    Conjunctiva/sclera: Conjunctivae normal.  Cardiovascular:     Rate and Rhythm: Normal rate and regular rhythm.     Pulses: Normal pulses.     Heart sounds: Normal heart sounds.  Pulmonary:     Effort: Pulmonary effort is normal. No respiratory distress.     Breath sounds: Normal breath sounds.  Abdominal:     General: Abdomen is flat.  Bowel sounds are normal.     Palpations: Abdomen is soft.     Tenderness: There is no abdominal tenderness.     Comments: Abdomen is soft, nontender and nondistended.  Skin:    General: Skin is warm and dry.     Findings: No lesion.  Neurological:     General: No focal deficit present.     Mental Status: He is alert and oriented to person, place, and time. Mental status is at baseline.     (all labs ordered are listed, but only abnormal results are displayed) Labs Reviewed  COMPREHENSIVE METABOLIC PANEL WITH GFR - Abnormal; Notable for the following components:      Result Value   Glucose, Bld 114 (*)    AST 226 (*)     ALT 126 (*)    All other components within normal limits  CBC - Abnormal; Notable for the following components:   WBC 16.6 (*)    Platelets 474 (*)    All other components within normal limits  URINALYSIS, ROUTINE W REFLEX MICROSCOPIC - Abnormal; Notable for the following components:   Color, Urine AMBER (*)    Ketones, ur 20 (*)    Protein, ur 30 (*)    Bacteria, UA RARE (*)    All other components within normal limits  ACETAMINOPHEN  LEVEL - Abnormal; Notable for the following components:   Acetaminophen  (Tylenol ), Serum <10 (*)    All other components within normal limits  LIPASE, BLOOD    EKG: None  Radiology: US  Abdomen Limited RUQ (LIVER/GB) Result Date: 08/06/2024 EXAM: Right Upper Quadrant Abdominal Ultrasound TECHNIQUE: Real-time ultrasonography of the right upper quadrant of the abdomen was performed. COMPARISON: None. CLINICAL HISTORY: RUQ abdominal pain. FINDINGS: LIVER: The liver demonstrates normal echogenicity. No intrahepatic biliary ductal dilatation. No mass. BILIARY SYSTEM: No evidence of pericholecystic fluid or wall thickening. No cholelithiasis. Negative sonographic Murphy's sign. Common bile duct is within normal limits measuring . RIGHT KIDNEY: The right kidney is grossly unremarkable in appearances without evidence of hydronephrosis, echogenic calculi or worrisome mass lesions. PANCREAS: Visualized portions of the pancreas are unremarkable. OTHER: No right upper quadrant ascites. IMPRESSION: 1. No acute abnormalities. Electronically signed by: Katheleen Faes MD 08/06/2024 09:45 AM EDT RP Workstation: [id number]     Procedures   Medications Ordered in the ED  ondansetron  (ZOFRAN -ODT) disintegrating tablet 4 mg (4 mg Oral Given 08/06/24 0240)  metoCLOPramide (REGLAN) injection 5 mg (5 mg Intravenous Given 08/06/24 0756)  sodium chloride 0.9 % bolus 1,000 mL (0 mLs Intravenous Stopped 08/06/24 0854)    Clinical Course as of 08/06/24 1012  Fri Aug 06, 2024   9051 Feeling better will PO challenge.  [JB]  1001 Passed PO challenge.  [JB]    Clinical Course User Index [JB] Willa Brocks, Warren SAILOR, PA-C                                 Medical Decision Making Amount and/or Complexity of Data Reviewed Labs: ordered. Radiology: ordered.  Risk Prescription drug management.   This patient presents to the ED for concern of abdominal pain, this involves an extensive number of treatment options, and is a complaint that carries with it a high risk of complications and morbidity.  The differential diagnosis includes cholecystitis, AAA, appendicitis, renal stone, UTI   Co morbidities that complicate the patient evaluation  Recent dental abscess on clindamycin.   Additional history obtained:  Additional  history obtained from ED visit 08/01/2024 for dental abscess.  Discharged on clindamycin.   Lab Tests:  I personally interpreted labs.  The pertinent results include:   Patient has leukocytosis of 16 which is similar to prior labs and he is seen for dental infection.  There is no anemia. CMP shows AST, ALT with mild elevation.  He has no anion gap.  He has normal kidney function. Denies alcohol use.  Does admit to taking Tylenol  for pain control.  Does not think he has been taking over normal dosing, none today.  Acetaminophen  level within normal limits.   Imaging Studies ordered:  I ordered imaging studies including right upper quadrant ultrasound I independently visualized and interpreted imaging which showed no acute findings I agree with the radiologist interpretation   Cardiac Monitoring: / EKG:  The patient was maintained on a cardiac monitor.     Problem List / ED Course / Critical interventions / Medication management  Patient presents with generalized abdominal pain associated nausea vomiting.  There is no diarrhea.  Patient is taking clindamycin for dental infection.  His dental infection is improving and his ENT exam is overall  reassuring with no sign of Ludwig's angina or peritonsillar abscess.  Abdominal exam and serial abdominal exams are reassuring with no focal tenderness.  I did obtain right upper quadrant ultrasound which shows no acute findings.  Patient's labs are remarkable for elevation in liver enzymes.  He does not admit to drinking alcohol.  He does admit to taking Tylenol  but his acetaminophen  level here is normal.  He has no electrolyte abnormalities. I have ordered medication including Zofran , Reglan, 1 L normal saline. Reevaluation of the patient after these medicines showed that the patient improved I have reviewed the patients home medicines and have made adjustments as needed. Patient has been primarily symptom-free since arrival in ER.  Has normal right upper quadrant ultrasound with no acute findings.  Hemodynamically stable, well-appearing.  Patient is tolerating oral intake.  Feel appropriate for discharge with outpatient follow-up. Will send him home with medications for symptom management.  Will require liver enzymes being rechecked.  Given her strict return precautions.      Final diagnoses:  Generalized abdominal pain  Elevated LFTs    ED Discharge Orders          Ordered    ondansetron  (ZOFRAN -ODT) 4 MG disintegrating tablet  Every 8 hours PRN        08/06/24 1003    promethazine (PHENERGAN) 25 MG suppository  Every 8 hours PRN        08/06/24 1003    oxyCODONE  (ROXICODONE ) 5 MG immediate release tablet  Every 6 hours PRN        08/06/24 1003               Kathleene Bergemann, Warren SAILOR, PA-C 08/06/24 1017    Neysa Caron PARAS, DO 08/06/24 1508

## 2024-08-06 NOTE — ED Notes (Signed)
 US  at bedside.

## 2024-08-06 NOTE — ED Triage Notes (Signed)
 Pt states he was recently seen for dental abscess, was started on abx, pt reports abdominal pain with n/v stating today

## 2024-08-06 NOTE — ED Notes (Signed)
 Pt given water and crackers for PO challenge

## 2024-08-09 ENCOUNTER — Encounter (INDEPENDENT_AMBULATORY_CARE_PROVIDER_SITE_OTHER): Payer: Self-pay | Admitting: Psychology

## 2024-09-13 ENCOUNTER — Ambulatory Visit: Payer: MEDICAID | Admitting: Allergy and Immunology

## 2024-09-16 ENCOUNTER — Ambulatory Visit (INDEPENDENT_AMBULATORY_CARE_PROVIDER_SITE_OTHER): Payer: MEDICAID | Admitting: Allergy and Immunology

## 2024-09-16 ENCOUNTER — Encounter: Payer: Self-pay | Admitting: Allergy and Immunology

## 2024-09-16 VITALS — BP 116/62 | HR 80 | Resp 18 | Ht 68.3 in | Wt 212.4 lb

## 2024-09-16 DIAGNOSIS — L2089 Other atopic dermatitis: Secondary | ICD-10-CM | POA: Diagnosis not present

## 2024-09-16 DIAGNOSIS — J301 Allergic rhinitis due to pollen: Secondary | ICD-10-CM | POA: Diagnosis not present

## 2024-09-16 DIAGNOSIS — J3089 Other allergic rhinitis: Secondary | ICD-10-CM | POA: Diagnosis not present

## 2024-09-16 MED ORDER — HYDROCORTISONE 2.5 % EX OINT: TOPICAL_OINTMENT | CUTANEOUS | 1 refills | Status: AC

## 2024-09-16 MED ORDER — OLOPATADINE HCL 0.2 % OP SOLN: OPHTHALMIC | 1 refills | Status: AC

## 2024-09-16 MED ORDER — CETIRIZINE HCL 10 MG PO TABS: ORAL_TABLET | ORAL | 1 refills | Status: AC

## 2024-09-16 MED ORDER — MOMETASONE FUROATE 50 MCG/ACT NA SUSP: NASAL | 1 refills | Status: AC

## 2024-09-16 NOTE — Patient Instructions (Addendum)
  1.  Allergen avoidance measures - dust mite, tree, grass  2.  Treat and prevent inflammation of airway:   A.  Generic Nasonex -2 sprays each nostril 3-7 times a week   3. If needed:   A. Cetrizine 10 mg - 1 tablet 1-2 times per day  B. Pataday  - 1 drop each eye 1 time per day  C. Water followed by hydrocortisone  2.5% ointment 1 time per day  4. Return to clinic in 12 weeks   5. Consider starting Immunotherapy  6. Influenza = Tamiflu. Covid = Paxlovid

## 2024-09-16 NOTE — Progress Notes (Signed)
 [place] - [place] - [place] - [place] - [place]   Follow-up Note  Referring Provider: Taft Jon PARAS, MD Primary Provider: Taft Jon PARAS, MD Date of Office Visit: 09/16/2024  Subjective:   Tony [name] (DOB: 29-May-2006) is a 18 y.o. male who [facility] in re-evaluation of the following:  HPI: [name] returns to this clinic in evaluation of allergic rhinoconjunctivitis and atopic dermatitis.  I last saw him in this clinic during his initial evaluation of 16 July 2023.  He has been having lots of problems with sneezing and itching of his nose and nasal congestion but he rarely uses any nasal steroid and there seems to be some aversion to nasal steroids.  It does not sound as though he has required a systemic steroid or an antibiotic for an airway issue.  He has also had a little bit more problem with his atopic dermatitis.  He is kind of itchy all over and he has spots involving his arms.  He is not using any topical steroid.  Allergies as of 09/16/2024       Reactions   Amoxicillin-pot Clavulanate Other (See Comments)   Blood in urine and stool   Adhesive [tape] Rash   Latex Rash   Omnicef  [cefdinir ] Hives, Rash        Medication List    cetirizine  10 MG tablet Commonly known as: ZYRTEC  Can take one tablet one to two times daily if needed.   hydrocortisone  2.5 % cream Apply to flare-ups of seborrhea behind ears bid when needed up to 5 days   hydrocortisone  2.5 % ointment Can apply to wet skin as directed once daily if needed.   IBUPROFEN PO Take by mouth as needed.   MELATONIN PO Take by mouth at bedtime.   mometasone  50 MCG/ACT nasal spray Commonly known as: NASONEX    mupirocin  ointment 2 % Commonly known as: BACTROBAN  Apply 1 Application topically 2 (two) times daily.   Olopatadine  HCl 0.2 % Soln Can use one drop in each eye once daily if needed. Started by: Shandrika Ambers J Alaiya Martindelcampo    ondansetron  4 MG disintegrating tablet Commonly known as: ZOFRAN -ODT Take 1 tablet (4 mg total) by mouth every 8 (eight) hours as needed for nausea or vomiting.   oxyCODONE  5 MG immediate release tablet Commonly known as: Roxicodone  Take 1 tablet (5 mg total) by mouth every 6 (six) hours as needed for severe pain (pain score 7-10) or breakthrough pain.   promethazine  25 MG suppository Commonly known as: PHENERGAN  Place 1 suppository (25 mg total) rectally every 8 (eight) hours as needed for refractory nausea / vomiting.    Past Medical History:  Diagnosis Date   Allergy     seasonal allergies   Astigmatism    wears glasses   Dental crowns present    upper   Developmental delay    Eczema    Hearing loss    failed hearing screening at school   Jaundice of newborn    resolved   Rash 04/06/2013   elbows   Speech delay    Tympanic membrane perforation 03/2013   left    Past Surgical History:  Procedure Laterality Date   ADENOIDECTOMY     ADENOIDECTOMY     CIRCUMCISION  2005-03-27   TYMPANOPLASTY  11/17/2012   Procedure: TYMPANOPLASTY;  Surgeon: Ana LELON Moccasin, MD;  Location: [facility];  Service: ENT;  Laterality: Right;  WITH GRAFT  TYMPANOPLASTY Left 04/12/2013   Procedure: LEFT TYMPANOPLASTY WITH TEMPORALIS FASCIA GRAFT ;  Surgeon: Ana LELON Moccasin, MD;  Location: [facility];  Service: ENT;  Laterality: Left;   TYMPANOSTOMY TUBE PLACEMENT     x 2    Review of systems negative except as noted in HPI / PMHx or noted below:  Review of Systems  Constitutional: Negative.   HENT: Negative.    Eyes: Negative.   Respiratory: Negative.    Cardiovascular: Negative.   Gastrointestinal: Negative.   Genitourinary: Negative.   Musculoskeletal: Negative.   Skin: Negative.   Neurological: Negative.   Endo/Heme/Allergies: Negative.   Psychiatric/Behavioral: Negative.       Objective:   Vitals:   09/16/24 1005  BP: 116/62  Pulse: 80  Resp: 18  SpO2:  97%   Height: 5' 8.3 (173.5 cm)  Weight: 212 lb 6.4 oz (96.3 kg)   Physical Exam Constitutional:      Appearance: He is not diaphoretic.  HENT:     Head: Normocephalic.     Right Ear: Tympanic membrane, ear canal and external ear normal.     Left Ear: Tympanic membrane, ear canal and external ear normal.     Nose: Nose normal. No mucosal edema or rhinorrhea.     Mouth/Throat:     Pharynx: Uvula midline. No oropharyngeal exudate.  Eyes:     Conjunctiva/sclera: Conjunctivae normal.  Neck:     Thyroid: No thyromegaly.     Trachea: Trachea normal. No tracheal tenderness or tracheal deviation.  Cardiovascular:     Rate and Rhythm: Normal rate and regular rhythm.     Heart sounds: Normal heart sounds, S1 normal and S2 normal. No murmur heard. Pulmonary:     Effort: No respiratory distress.     Breath sounds: Normal breath sounds. No stridor. No wheezing or rales.  Lymphadenopathy:     Head:     Right side of head: No tonsillar adenopathy.     Left side of head: No tonsillar adenopathy.     Cervical: No cervical adenopathy.  Skin:    Findings: Rash (Diffuse lichenification and hyperpigmentation of extremities) present. No erythema.     Nails: There is no clubbing.  Neurological:     Mental Status: He is alert.     Diagnostics: none  Assessment and Plan:   1. Perennial allergic rhinitis   2. Seasonal allergic rhinitis due to pollen   3. Other atopic dermatitis    1.  Allergen avoidance measures - dust mite, tree, grass  2.  Treat and prevent inflammation of airway:   A.  Generic Nasonex -2 sprays each nostril 3-7 times a week   3. If needed:   A. Cetrizine 10 mg - 1 tablet 1-2 times per day  B. Pataday  - 1 drop each eye 1 time per day  C. Water followed by hydrocortisone  2.5% ointment 1 time per day  4. Return to clinic in 12 weeks   5. Consider starting Immunotherapy  6. Influenza = Tamiflu. Covid = Paxlovid  [name] has multiorgan atopic disease and he would  be best served by starting a course of immunotherapy and I once again talk with his mom about this issue during today's visit.  If he would use some nasal steroids he might do a little bit better.  I have encouraged him to use a topical steroid as well after taking a shower or a bath to address his atopic dermatitis.  Will see what happens over the  course of the next 12 weeks with this approach.  Camellia Denis, MD Allergy  / Immunology [facility]

## 2024-09-20 ENCOUNTER — Encounter: Payer: Self-pay | Admitting: Allergy and Immunology

## 2024-11-08 ENCOUNTER — Ambulatory Visit
Admission: EM | Admit: 2024-11-08 | Discharge: 2024-11-08 | Disposition: A | Discharge status: Home / Self Care | Payer: MEDICAID | Attending: Physician Assistant | Admitting: Physician Assistant

## 2024-11-08 ENCOUNTER — Encounter: Payer: Self-pay | Admitting: Emergency Medicine

## 2024-11-08 DIAGNOSIS — K0889 Other specified disorders of teeth and supporting structures: Secondary | ICD-10-CM | POA: Diagnosis not present

## 2024-11-08 MED ORDER — CLINDAMYCIN HCL 300 MG PO CAPS: 300.0000 mg | ORAL_CAPSULE | Freq: Three times a day (TID) | ORAL | 0 refills | Status: AC

## 2024-11-08 NOTE — ED Triage Notes (Signed)
 Patient has had swelling and dental pain for several days.  Patient took advil.

## 2024-11-08 NOTE — Discharge Instructions (Addendum)
 Return if any problems.

## 2024-11-10 ENCOUNTER — Telehealth: Payer: Self-pay | Admitting: Pediatrics

## 2024-11-10 ENCOUNTER — Other Ambulatory Visit: Payer: Self-pay | Admitting: Pediatrics

## 2024-11-10 NOTE — Progress Notes (Signed)
  [facility]     [phone] [address]  [address] [place] [address] Se habla espaol From 1 to 20 years old Parent may go with child Bryan Cobb DDS     [phone] [address] [place]  [address] Se habla espaol From 2 to 13 years old Parent may NOT go with child  [facility]    [phone] [address] [place] [address] Se habla espaol Vietnamese spoken From 2 years old Parent may go with child [facility]     [phone] [address] [place] [address] Se habla espaol From 1 to 20 years old Parent may NOT go with child  Thane Hisaw DDS     [phone] [facility]      [address]  [place] [address] No se habla espaol From teeth coming in Parent may go with child   [facility]     [phone] [address] [place] [address] Requires certification. Call for information. Requiere certificacin. Llame para informacin. Algunos dias se habla espaol  From birth to 20 years Parent possibly goes with child  Herbert McNeal DDS     [phone] [address]  [address] [place] [address] Se habla espaol From 18 months to 18 years  Parent may go with child   J. Howard McMasters DDS    [phone] Eric J. Sadler DDS [address] [place] [address] Se habla espaol From 1 year old Parent may go with child  Perry Jeffries DDS    [phone] [address] [place] [address] Se habla espaol  From 18 months old Parent may go with child J. Selig Cooper DDS    [phone] [address] [place] [address] Se habla espaol From 5 to 26 years old Parent may go with child  [facility]    [phone] [address] [place] [address] No se habla espaol From birth Parent may not go with child         

## 2024-11-11 NOTE — ED Provider Notes (Signed)
 " [name]    CSN: [id number] Arrival date & time: 11/08/24  1638      History   Chief Complaint Chief Complaint  Patient presents with   Dental Pain    HPI Tony [name] is a 19 y.o. male.   Patient complains of facial swelling and discomfort.  Patient does not currently have a dentist.  Patient has not had any fever or chills no difficulty swallowing no difficulty breathing.  The history is provided by a parent.  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   Gynecomastia 08/09/2013   Allergic rhinitis 08/09/2013   Body mass index, pediatric, greater than or equal to 95th percentile for age 32/13/2014   Developmental delay 08/09/2013    Past Surgical History:  Procedure Laterality Date   ADENOIDECTOMY     ADENOIDECTOMY     CIRCUMCISION  2006   TYMPANOPLASTY  11/17/2012   Procedure: TYMPANOPLASTY;  Surgeon: Ana LELON Moccasin, MD;  Location: [facility];  Service: ENT;  Laterality: Right;  WITH GRAFT    TYMPANOPLASTY Left 04/12/2013   Procedure: LEFT TYMPANOPLASTY WITH TEMPORALIS FASCIA GRAFT ;  Surgeon: Ana LELON Moccasin, MD;  Location: [facility];  Service: ENT;  Laterality: Left;   TYMPANOSTOMY TUBE PLACEMENT     x 2       Home Medications    Prior to Admission medications  Medication Sig Start Date End Date Taking? Authorizing Provider  clindamycin  (CLEOCIN ) 300 MG capsule Take 1 capsule (300 mg total) by mouth 3 (three) times  daily for 10 days. 11/08/24 11/18/24 Yes Beulah Matusek K, PA-C  cetirizine  (ZYRTEC ) 10 MG tablet Can take one tablet one to two times daily if needed. 09/16/24   Kozlow, Camellia PARAS, MD  hydrocortisone  2.5 % cream Apply to flare-ups of seborrhea behind ears bid when needed up to 5 days 05/16/24   [name], Angela J, MD  hydrocortisone  2.5 % ointment Can apply to wet skin as directed once daily if needed. 09/16/24   Kozlow, Eric J, MD  IBUPROFEN PO Take by mouth as needed.    [provider]  MELATONIN PO Take by mouth at bedtime.    [provider]  mometasone  (NASONEX ) 50 MCG/ACT nasal spray Can use two sprays in each nostril three to seven times per week as directed. 09/16/24   Kozlow, Eric J, MD  mupirocin  ointment (BACTROBAN ) 2 % Apply 1 Application topically 2 (two) times daily. Patient taking differently: Apply 1 Application topically 2 (two) times daily. As needed 03/28/24   Rising, Asberry, PA-C  Olopatadine  HCl 0.2 % SOLN Can use one drop in each eye once daily if needed. 09/16/24   Kozlow, Camellia PARAS, MD  ondansetron  (ZOFRAN -ODT) 4 MG disintegrating tablet Take 1 tablet (4 mg total) by mouth every 8 (eight) hours as needed for nausea or vomiting.  08/06/24   Barrett, Warren SAILOR, PA-C  oxyCODONE  (ROXICODONE ) 5 MG immediate release tablet Take 1 tablet (5 mg total) by mouth every 6 (six) hours as needed for severe pain (pain score 7-10) or breakthrough pain. 08/06/24   Barrett, Jamie N, PA-C  promethazine  (PHENERGAN ) 25 MG suppository Place 1 suppository (25 mg total) rectally every 8 (eight) hours as needed for refractory nausea / vomiting. 08/06/24   Barrett, Warren SAILOR, PA-C    Family History Family History  Problem Relation Age of Onset   Asthma Mother    Hypertension Mother    Sickle cell trait Mother    Thyroid cancer Mother        thyroidectomy with resulting nerve damage to vocal cords   Anesthesia problems Mother        hx. of waking up during surgery   Asthma Brother    Sickle  cell trait Brother    Diabetes Maternal Grandmother    Heart disease Maternal Grandmother        Died at 54   Other Maternal Grandfather        Died form ADRS   Diabetes Maternal Aunt     Social History Social History[1]   Allergies   Amoxicillin-pot clavulanate, Adhesive [tape], Latex, and Omnicef  [cefdinir ]   Review of Systems Review of Systems  All other systems reviewed and are negative.    Physical Exam Triage Vital Signs ED Triage Vitals  Encounter Vitals Group     BP 11/08/24 1805 123/75     Girls Systolic BP Percentile --      Girls Diastolic BP Percentile --      Boys Systolic BP Percentile --      Boys Diastolic BP Percentile --      Pulse Rate 11/08/24 1805 79     Resp 11/08/24 1805 16     Temp 11/08/24 1805 98.6 F (37 C)     Temp Source 11/08/24 1805 Oral     SpO2 11/08/24 1805 98 %     Weight --      Height --      Head Circumference --      Peak Flow --      Pain Score 11/08/24 1803 3     Pain Loc --      Pain Education --      Exclude from Growth Chart --    No data found.  Updated Vital Signs BP 123/75 (BP Location: Left Arm)   Pulse 79   Temp 98.6 F (37 C) (Oral)   Resp 16   SpO2 98%   Visual Acuity Right Eye Distance:   Left Eye Distance:   Bilateral Distance:    Right Eye Near:   Left Eye Near:    Bilateral Near:     Physical Exam Vitals reviewed.  Constitutional:      Appearance: Normal appearance.  HENT:     Nose: Nose normal.     Mouth/Throat:     Comments: Facial swelling, nontender, Cardiovascular:     Rate and Rhythm: Normal rate.  Pulmonary:     Effort: Pulmonary effort is normal.  Musculoskeletal:        General: Normal range of motion.  Skin:    General: Skin is warm.  Neurological:     General: No focal deficit present.     Mental Status: He is alert.      UC Treatments / Results  Labs (all labs ordered are listed, but only abnormal  results are displayed) Labs Reviewed - No data to  display  EKG   Radiology No results found.  Procedures Procedures (including critical care time)  Medications Ordered in UC Medications - No data to display  Initial Impression / Assessment and Plan / UC Course  I have reviewed the triage vital signs and the nursing notes.  Pertinent labs & imaging results that were available during my care of the patient were reviewed by me and considered in my medical decision making (see chart for details).    Patient is given a prescription for clindamycin  dental referral information.  Final Clinical Impressions(s) / UC Diagnoses   Final diagnoses:  Toothache     Discharge Instructions      Return if any problems.    ED Prescriptions     Medication Sig Dispense Auth. Provider   clindamycin  (CLEOCIN ) 300 MG capsule Take 1 capsule (300 mg total) by mouth 3 (three) times daily for 10 days. 30 capsule Iman Reinertsen K, PA-C      PDMP not reviewed this encounter. An After Visit Summary was printed and given to the patient.        [1]  Social History Tobacco Use   Smoking status: Never    Passive exposure: Never   Smokeless tobacco: Never  Vaping Use   Vaping status: Never Used  Substance Use Topics   Alcohol use: No   Drug use: No     Flint Sonny POUR, PA-C 11/11/24 1346  "

## 2024-11-26 ENCOUNTER — Telehealth (INDEPENDENT_AMBULATORY_CARE_PROVIDER_SITE_OTHER): Payer: Self-pay | Admitting: Psychology

## 2024-11-26 ENCOUNTER — Ambulatory Visit: Payer: Self-pay

## 2024-11-26 ENCOUNTER — Encounter: Payer: Self-pay | Admitting: Pediatrics

## 2024-11-26 ENCOUNTER — Telehealth: Payer: MEDICAID | Admitting: Pediatrics

## 2024-11-26 DIAGNOSIS — K047 Periapical abscess without sinus: Secondary | ICD-10-CM

## 2024-11-26 NOTE — Telephone Encounter (Signed)
"  °  Name of who is calling: VALERIE   Caller's Relationship to Patient:  MOM  Best contact number:  [phone]  Provider they see: Dr Bettyann   Reason for call: mom stated she is still waiting on test results.      PRESCRIPTION REFILL ONLY  Name of prescription:  Pharmacy:   "

## 2024-11-26 NOTE — Progress Notes (Signed)
 "    Patient ID: Tony [name], male    DOB: 11-17-2005, 19 y.o.   MRN: [id number]  Virtual Visit via Video Note  I connected with Delos D Rolin  on 11/26/24 at  1:30 PM EST by a video enabled telemedicine application and verified that I am speaking with the correct person using two identifiers.   Location of patient/parent: at hotel in [place]   I discussed the limitations of evaluation and management by telemedicine and the availability of in person appointments.  I advised Tony [name]  that by engaging in this telehealth visit, he consents to the provision of healthcare.  Additionally, he authorizes for his insurance to be billed for the services provided during this telehealth visit.  He expressed understanding and agreed to proceed.  Reason for visit: tooth problem  History of Present Illness: Tony [name] states he has had problems with his right bottom molar since October.  He recounts his ED visit 10/05 at which clindamycin  was given and he was told to follow up with dentistry/oral surgery.  He was seen by Dr. Maryruth outpatient; however, his insurance did not cover the costs (mom states > $300) so they did not continue care there.  Si states he was given 2 other providers to call but did not have luck in getting through to those offices.  Pain returned in Jan and he went back to ED 1/12 and received another round of antibiotics.  He states there was a large abscess and it eventually ruptured with blood and pus noted.  Mom called EMS due to worry over him swallowing some of the pus and blood; he was assessed and reassured by EMS and urged to follow up with dental. Telford states after the abscess ruptured he was able to eat and sleep better so decided to wait a while longer but is now concerned bc facial swelling is back.  Tony [name] admits to poor oral hygiene and dental problems. He states pain never fully goes away and recalls he was told both the infected tooth and his wisdom teeth  need removal.  He asks for guidance in seeking further care.  Tony [name] allows this provider to speak with his mother.  Mom states the same and adds Jeanne is reluctant to go to the dentist, leading to his tolerating the discomfort until severe.  Mom states she has had difficulty accessing MyChart lately and did not see our response to her on 1/14 with list of dentists and has not called.  Family has transportation challenges but mom states she can use Uber to get to appointment.  Currently using peroxide mouth rinse, cool compress and OTC pain management as needed.  No other concerns or modifying factors.  PMH, problem list, medications and allergies, family and social history reviewed and updated as indicated.   EHR is reviewed and shows care last accessed 1/12 in ED with clindamycin  prescribed x 10 days. EHR shows family contact with our office on 01/14 by MyChart and our response to them; our response has not been read (c/w mom's report of access problems).   Observations/Objective: Tony [name] is seen in the home, conversing with normal facial movement and voice clarity. He attempts to show the involved tooth but I can not see adequately and lighting is insufficient. Right cheek appears a little swollen in comparison to left; he has some light beard which obscures contours under jawline  Assessment and Plan: Tooth infection  Mandeep presents with prolonged problem with tooth infection over the past 3  months with pain flaring when he stops the antibiotics. I discussed with him the fact the tooth needs extraction and continued use of antibiotic with false sense of problem handled may lead to infection spreading deeper and involving bone. Also, risk for antibiotic resistance. I attempted to reassure him that any procedure would include pain management and any surgery would likely involve general anesthesia. In conversation with mom I reviewed the dentists on the list and informed her the 3  family dental offices are closed on Friday pm and she will need to contact them on Monday. If fever, worsened pain, access care at ED. Onsite visit here next week for probable dental pre-op visit. [place] participated in decision making and voiced understanding and agreement.  Follow Up Instructions: As above.  Appt set for Friday 12/03/24 here at 1:30 pm with [place] + mom in agreement with this time and plan.   I discussed the assessment and treatment plan with the patient and/or parent/guardian. They were provided an opportunity to ask questions and all were answered. They agreed with the plan and demonstrated an understanding of the instructions.   They were advised to call back or seek an in-person evaluation in the emergency room if the symptoms worsen or if the condition fails to improve as anticipated.  Time spent reviewing chart in preparation for visit:  3 minutes Time spent face-to-face with patient: 35 minutes Time spent not face-to-face with patient for documentation and care coordination on date of service: 5 minutes  I personally spent a total of 43 minutes in the care of the patient today including preparing to see the patient, getting/reviewing separately obtained history, performing a medically appropriate exam/evaluation, counseling and educating, documenting clinical information in the EHR, and coordinating care.   I was located at [facility] during this encounter.  Jon JINNY Bars, MD             "

## 2024-12-01 ENCOUNTER — Telehealth (INDEPENDENT_AMBULATORY_CARE_PROVIDER_SITE_OTHER): Payer: Self-pay | Admitting: Psychology

## 2024-12-01 NOTE — Telephone Encounter (Signed)
 Called pt's mother and advised that evaluation has not been able to be completed due to two rating scales being incomplete at this time. Ratings scales were last sent to sent to pt's mother in August of 2025. Clinician left a VM and will be resending rating scales. Also advised pt's mother that she can make an appointment to complete the rating scales in the office if that is easier for her.

## 2024-12-02 ENCOUNTER — Telehealth: Payer: Self-pay | Admitting: Pediatrics

## 2024-12-02 NOTE — Telephone Encounter (Signed)
 I called mom to see if Tony [name] got scheduled with dentist and how he is doing.  Mom states they did not get to call yet but will call today the dentists we discussed.  He is better but not well and mom states they will keep appointment here tomorrow.

## 2024-12-03 ENCOUNTER — Ambulatory Visit: Payer: MEDICAID | Admitting: Pediatrics

## 2024-12-06 ENCOUNTER — Ambulatory Visit: Payer: MEDICAID | Admitting: Pediatrics

## 2024-12-08 ENCOUNTER — Ambulatory Visit: Payer: MEDICAID | Admitting: Allergy and Immunology
